# Patient Record
Sex: Female | Born: 1975 | Race: White | Hispanic: No | Marital: Married | Smoking: Never smoker
Health system: Southern US, Community
[De-identification: ages and names within clinical notes are randomized; demographics above are authoritative.]

## PROBLEM LIST (undated history)

## (undated) DIAGNOSIS — N3090 Cystitis, unspecified without hematuria: Secondary | ICD-10-CM

## (undated) DIAGNOSIS — J45909 Unspecified asthma, uncomplicated: Secondary | ICD-10-CM

## (undated) DIAGNOSIS — E271 Primary adrenocortical insufficiency: Secondary | ICD-10-CM

## (undated) DIAGNOSIS — I10 Essential (primary) hypertension: Secondary | ICD-10-CM

## (undated) DIAGNOSIS — M8580 Other specified disorders of bone density and structure, unspecified site: Secondary | ICD-10-CM

## (undated) DIAGNOSIS — N809 Endometriosis, unspecified: Secondary | ICD-10-CM

## (undated) DIAGNOSIS — N83209 Unspecified ovarian cyst, unspecified side: Secondary | ICD-10-CM

## (undated) HISTORY — PX: APPENDECTOMY: SHX54

## (undated) HISTORY — PX: KNEE SURGERY: SHX244

## (undated) HISTORY — PX: ABLATION ON ENDOMETRIOSIS: SHX5787

## (undated) HISTORY — PX: HERNIA REPAIR: SHX51

## (undated) HISTORY — PX: ABDOMINAL HYSTERECTOMY: SHX81

## (undated) HISTORY — PX: OVARIAN CYST REMOVAL: SHX89

## (undated) HISTORY — PX: CHOLECYSTECTOMY: SHX55

---

## 1998-01-20 ENCOUNTER — Encounter: Payer: Self-pay | Admitting: Emergency Medicine

## 1998-01-20 ENCOUNTER — Emergency Department (HOSPITAL_COMMUNITY): Admission: EM | Admit: 1998-01-20 | Discharge: 1998-01-20 | Payer: Self-pay | Admitting: Emergency Medicine

## 1999-07-17 ENCOUNTER — Other Ambulatory Visit: Admission: RE | Admit: 1999-07-17 | Discharge: 1999-07-17 | Payer: Self-pay | Admitting: Family Medicine

## 2000-07-18 ENCOUNTER — Other Ambulatory Visit: Admission: RE | Admit: 2000-07-18 | Discharge: 2000-07-18 | Payer: Self-pay | Admitting: Family Medicine

## 2001-07-21 ENCOUNTER — Other Ambulatory Visit: Admission: RE | Admit: 2001-07-21 | Discharge: 2001-07-21 | Payer: Self-pay | Admitting: Family Medicine

## 2003-09-16 ENCOUNTER — Other Ambulatory Visit: Admission: RE | Admit: 2003-09-16 | Discharge: 2003-09-16 | Payer: Self-pay | Admitting: Family Medicine

## 2004-10-29 ENCOUNTER — Other Ambulatory Visit: Admission: RE | Admit: 2004-10-29 | Discharge: 2004-10-29 | Payer: Self-pay | Admitting: Family Medicine

## 2004-12-31 ENCOUNTER — Ambulatory Visit (HOSPITAL_COMMUNITY): Admission: RE | Admit: 2004-12-31 | Discharge: 2004-12-31 | Payer: Self-pay | Admitting: Family Medicine

## 2005-01-01 ENCOUNTER — Encounter (INDEPENDENT_AMBULATORY_CARE_PROVIDER_SITE_OTHER): Payer: Self-pay | Admitting: *Deleted

## 2005-01-01 ENCOUNTER — Ambulatory Visit (HOSPITAL_COMMUNITY): Admission: RE | Admit: 2005-01-01 | Discharge: 2005-01-01 | Payer: Self-pay | Admitting: General Surgery

## 2005-06-19 ENCOUNTER — Encounter: Admission: RE | Admit: 2005-06-19 | Discharge: 2005-06-19 | Payer: Self-pay | Admitting: Family Medicine

## 2005-11-05 ENCOUNTER — Other Ambulatory Visit: Admission: RE | Admit: 2005-11-05 | Discharge: 2005-11-05 | Payer: Self-pay | Admitting: Family Medicine

## 2006-11-06 ENCOUNTER — Other Ambulatory Visit: Admission: RE | Admit: 2006-11-06 | Discharge: 2006-11-06 | Payer: Self-pay | Admitting: Family Medicine

## 2007-06-24 ENCOUNTER — Encounter: Admission: RE | Admit: 2007-06-24 | Discharge: 2007-06-24 | Payer: Self-pay | Admitting: Family Medicine

## 2007-06-24 ENCOUNTER — Encounter (INDEPENDENT_AMBULATORY_CARE_PROVIDER_SITE_OTHER): Payer: Self-pay | Admitting: Surgery

## 2007-06-24 ENCOUNTER — Ambulatory Visit (HOSPITAL_COMMUNITY): Admission: EM | Admit: 2007-06-24 | Discharge: 2007-06-26 | Payer: Self-pay | Admitting: Emergency Medicine

## 2007-11-10 ENCOUNTER — Other Ambulatory Visit: Admission: RE | Admit: 2007-11-10 | Discharge: 2007-11-10 | Payer: Self-pay | Admitting: Family Medicine

## 2008-02-15 ENCOUNTER — Encounter: Admission: RE | Admit: 2008-02-15 | Discharge: 2008-02-15 | Payer: Self-pay | Admitting: Family Medicine

## 2008-05-30 ENCOUNTER — Encounter: Admission: RE | Admit: 2008-05-30 | Discharge: 2008-05-30 | Payer: Self-pay | Admitting: Family Medicine

## 2010-02-12 ENCOUNTER — Other Ambulatory Visit: Admission: RE | Admit: 2010-02-12 | Discharge: 2010-02-12 | Payer: Self-pay | Admitting: Obstetrics and Gynecology

## 2010-08-21 NOTE — Discharge Summary (Signed)
Marissa Gibson, Marissa Gibson                ACCOUNT NO.:  [id number]   MEDICAL RECORD NO.:  [id number]          PATIENT TYPE:  INP   LOCATION:  1602                         FACILITY:  [facility]   PHYSICIAN:  Maisie Fus A. Cornett, M.D.DATE OF BIRTH:  Jun 11, 1975   DATE OF ADMISSION:  06/24/2007  DATE OF DISCHARGE:  06/25/2007                               DISCHARGE SUMMARY   ADMITTING DIAGNOSIS:  Acute appendicitis.   DISCHARGE DIAGNOSIS:  Acute appendicitis.   PROCEDURES PERFORMED:  Laparoscopic appendectomy.   BRIEF HISTORY:  The patient is a 35 year old female who came in  yesterday with acute appendicitis.  She underwent laparoscopic  appendectomy last night.   HOSPITAL COURSE:  The course was unremarkable.  Postop day 1 she was  afebrile.  A JP drain was in place.  She was draining serous fluid.  Her  abdomen is soft, nontender.  Her diet was advanced, and she was able to  ambulate.  She was discharged home on postoperative day 1 in  satisfactory condition.   DISCHARGE INSTRUCTIONS:  She will follow up in 1 week with me.  We will  have her keep her drain in until next week, and show her how to empty it  daily.  She may shower.  She will not drive for 1 week and will stay out  of work for 2 weeks.  She was given a prescription for Vicodin ES 1 to 2  tablets q.4 h. p.r.n. for pain, and Cipro 500 mg p.o. b.i.d. for 7 days.   CONDITION AT DISCHARGE:  Improved.      Thomas A. Cornett, M.D.  Electronically Signed     TAC/MEDQ  D:  06/25/2007  T:  06/25/2007  Job:  [id number]

## 2010-08-21 NOTE — Op Note (Signed)
NAMEBRYSTAL, Marissa Gibson                ACCOUNT NO.:  [id number]   MEDICAL RECORD NO.:  [id number]          PATIENT TYPE:  EMS   LOCATION:  ED                           FACILITY:  [facility]   PHYSICIAN:  Clovis Pu. Gibson, M.D.DATE OF BIRTH:  1975/10/18   DATE OF PROCEDURE:  06/24/2007  DATE OF DISCHARGE:                               OPERATIVE REPORT   PREOPERATIVE DIAGNOSIS:  Acute appendicitis.   POSTOPERATIVE DIAGNOSIS:  Gangrenous appendicitis.   PROCEDURE:  Laparoscopic appendectomy.   SURGEON:  Harriette Bouillon, M.D.   ANESTHESIA:  General endotracheal anesthesia with 0.25% Sensorcaine.   ESTIMATED BLOOD LOSS:  20 mL.   SPECIMEN:  Gangrenous appendix without evidence of perforation but  significant periappendiceal inflammation involving the terminal ileum  and cecum and appendix sent to pathology for evaluation.   DRAIN:  19 Blake drain to right paracolic gutter inflammatory rind.   INDICATIONS FOR PROCEDURE:  The patient is a 35 year old female with  history of right lower quadrant pain.  She presents for laparoscopic  appendectomy after CT showed acute appendicitis and examination  confirmed that.  Informed consent was obtained.  She voiced  understanding and agreed to proceed.   DESCRIPTION OF PROCEDURE:  The patient brought to the operating room,  placed supine.  Foley catheter was placed.  After induction of general  anesthesia, the abdomen was prepped and draped in sterile fashion.  A  previous laparoscopy scar just below the umbilicus was used for reentry  to the abdominal cavity.  Fascia was opened and I was able to place my  finger in the preperitoneal space, sweep around and find no signs of  adhesions.  Pursestring suture of 0 Vicryl was placed and a 12-mm Hassan  cannula was placed under direct vision.  Pneumoperitoneum was created to  15 mmHg with CO2 and a laparoscope was placed.  Two 5 mm ports were  placed, one in the midline just below the umbilicus, second  left lower  quadrant.  Cecum was identified.  The appendix was adherent to the right  fallopian tube and I was able to grab the appendix and pull this off  that.  The appendix was slightly retrocecal.  It was significantly  inflamed.  There is significant inflammation of the terminal ileum from  this as well as the cecum.  I was able to identify the tip and then used  a harmonic scalpel to take the mesoappendix down until I got to the near  the base the cecum.  I could not quite get all the mesoappendix down, so  I created a window at the junction of the appendix and cecum and then  using a 5-mm scope for visualization was able to insert a GIA 45  stapling device once across the base the appendix and fire it.  I then  was able to better see the mesentery of the appendix and take this down  with the harmonic scalpel taking great care to stay away from the  colonic wall.  There was significant edema and swelling and it was a  little bit difficult to find  the plane between the appendix and the  cecum.  We were very careful about doing this and stayed as close the  appendix as I could with the harmonic scalpel.  The entire appendix was  then removed with an EndoCatch bag.  I carefully inspected the base of  the cecum.  The staple line was intact.  It was hemostatic but there was  significant inflammation.  I had some concern for staple line breakdown  even though the tissue appeared viable.  After irrigating this out, I  carefully examined it and saw no injury to the colon.  I elected to  place a drain due to the inflammatory rind that was associated with the  cecum and the ileum.  We suctioned out any excess irrigation and  hemostasis was excellent.  We then examined the right ovary, fallopian  tube were intact but a little bit inflamed since they were adherent to  the appendix.  Excess irrigation was suctioned out.  I could not see the  left ovary or fallopian tube.  The remainder of the  abdominal  examination was within normal limits.  At this point in time after the  excess irrigation was suctioned out, I placed a 19 Blake drain in the  right paracolic gutter near the cecum.  We secured and pulled it out  through the inferior port site and secured it with 3-0 nylon.  We then  let the CO2 escape, removed our ports and closed the umbilical fascia  with the pursestring suture of 0 Vicryl and 4-0 Monocryl was then used  to close skin incisions.  Dermabond was applied.  All final counts of  sponge, needle and instruments were found to be correct this portion of  the case.  The patient was then awoke, taken to recovery in satisfactory  condition.  All final counts were found be correct x2.      Marissa Gibson, M.D.  Electronically Signed     TAC/MEDQ  D:  06/24/2007  T:  06/25/2007  Job:  [id number]

## 2010-08-21 NOTE — H&P (Signed)
Marissa Gibson, Marissa Gibson                ACCOUNT NO.:  [id number]   MEDICAL RECORD NO.:  [id number]          PATIENT TYPE:  INP   LOCATION:  0098                         FACILITY:  [facility]   PHYSICIAN:  Maisie Fus A. Cornett, M.D.DATE OF BIRTH:  21-Mar-1976   DATE OF ADMISSION:  06/24/2007  DATE OF DISCHARGE:                              HISTORY & PHYSICAL   CHIEF COMPLAINT:  Right lower quadrant pain.   HISTORY OF PRESENT ILLNESS:  The patient is a 35 year old female sent  from Urgent Care due to a one-day history of right lower quadrant pain  and epigastric pain.  The pain is sharp in nature and located in the  right lower quadrant, it is a 10/10, and made worse with movement.  She  does have some episodic waves of pain as well with it.  No nausea,  vomiting, or diarrhea.   PAST MEDICAL HISTORY:  None.   PAST SURGICAL HISTORY:  Cholecystectomy.   FAMILY HISTORY:  Noncontributory.   SOCIAL HISTORY:  Denies tobacco or alcohol use, she is married.   ALLERGIES:  1. CECLOR.  2. SUDAFED.  3. AMITRIPTYLINE.   MEDICATIONS:  Lanetta Inch, Claritin, TriNessa, Voltaren, aspirin, Tylenol,  Phenergan, and Bentyl.   REVIEW OF SYSTEMS:  As stated above, she has had chronic diarrhea,  otherwise negative x15.   PHYSICAL EXAMINATION:  VITAL SIGNS:  Temperature 97, pulse 98, blood  pressure 117/80.  GENERAL APPEARANCE:  A pleasant female in no apparent distress.  HEENT:  Extraocular movements are intact, no evidence of scleral  icterus.  NECK:  Supple, nontender, trachea midline, no mass.  PULMONARY:  Lung sounds are clear, chest wall motion normal.  CARDIOVASCULAR:  Regular rate and rhythm without rub, murmur, or gallop.  EXTREMITIES:  Warm and well perfused.  ABDOMEN:  Tender over her right lower quadrant with rebound and guarding  at McBurney's point, no mass, no hernia.  EXTREMITIES:  Muscle tone normal, range of motion normal.  NEUROLOGICAL EXAMINATION:  Motor and sensory function are grossly  intact.  Glasgow Coma Scale is 15.   CT scan of the abdomen showed acute appendicitis with significant  periappendiceal inflammation without abscess, she has a white count of  13,000.   IMPRESSION:  Acute appendicitis.   PLAN:  We will take her to the operating room for laparoscopic  appendectomy.  The risk of bleeding, infection, abscess, injury to other  organs, fistula, bowel and DVT, are all potential risks of procedure.  She voices her understanding and agrees to proceed.      Thomas A. Cornett, M.D.  Electronically Signed     TAC/MEDQ  D:  06/24/2007  T:  06/24/2007  Job:  [id number]

## 2010-08-24 NOTE — Op Note (Signed)
NAMEGWENITH, TSCHIDA                ACCOUNT NO.:  [id number]   MEDICAL RECORD NO.:  [id number]          PATIENT TYPE:  AMB   LOCATION:  DAY                          FACILITY:  [facility]   PHYSICIAN:  Anselm Pancoast. Weatherly, M.D.DATE OF BIRTH:  [birth year]   DATE OF PROCEDURE:  01/01/2005  DATE OF DISCHARGE:                                 OPERATIVE REPORT   PREOPERATIVE DIAGNOSIS:  Chronic cholecystitis.   POSTOPERATIVE DIAGNOSES:  Not given.   OPERATION:  Laparoscopic cholecystectomy with cholangiogram, a lot of sludge  in the gallbladder.   SURGEON:  Anselm Pancoast. Zachery Dakins, M.D.   ASSISTANT:  Nurse.   HISTORY:  Marissa Gibson is a 35 year old female who was referred to our  office yesterday afternoon with the following history. She was on a cruise  in the [place] last week and on Thursday had the onset of significant  right upper quadrant pain, was seen in the infirmary on the cruise ship, had  an elevated white count and was placed on antibiotics and pain medication  and nausea medication. The following day, the cruise ended, she flew back  and then on Saturday was seen in the [place] walk-in clinic and was thought to  be having an acute gallbladder problem. She was somewhat better and her  white count that had been significantly elevated on Thursday was better at  13,000 and she was recommended to have an ultrasound on Monday morning and  to see her regular physician. She did, the ultrasound showed definitely  sludge within the gallbladder, not really a significantly thickened  gallbladder, no evidence of any common bile duct dilatation and her liver  functions were normal. I saw her in the office yesterday for the first time  and she was definitely still mildly tender in the right upper quadrant, not  tender in the lower abdomen, has a history of kind of irritable bowel  symptoms but has never had a barium enema or problems. She was definitely  not tender in the right lower quadrant.  I recommended that we proceed on  with a laparoscopic cholecystectomy and cholangiogram. I reviewed the x-rays  this morning with the radiologist and her gallbladder is at least 50% just  filled with this very thick cholesterol crystals sludge type area. It was  not pericystic fluid around and I discussed with her this morning sand he  said that she was feeling somewhat better but wanted to proceed on with  surgery. I did not repeat the white count today. Preoperatively, she was  given 3 grams of Unasyn and she has PAS stockings and positioned on the OR  table. Induction of general anesthesia, endotracheal tube, oral tube into  the stomach and then the abdomen was prepped with Betadine surgical scrub  and solution and draped in a sterile manner. She has had no previous  abdominal surgery. A small incision was made below the umbilicus. The fascia  was picked up with two Kocher's and a small opening carefully made into the  peritoneal cavity. The pursestring suture of #0 Vicryl was placed, the  Hasson cannula introduced. The  upper inspection, the gallbladder was  distended but not cherry red or acutely inflamed. The upper 10 mm trocar was  placed in the subxiphoid area under direct vision after anesthetizing the  fascia and the two lateral 5 mm trocars were placed at the appropriate  anterior lateral position. The gallbladder was grasped and retracted upward  and outward and the peritoneum was kind of carefully dissected from the  gallbladder. It was definitely subacutely inflamed and possibly a stone so  it impacted right at the cystic duct gallbladder junction when you were  manipulating it. I sort of encompassed the cystic duct, stripped it free of  the surrounding fibrous tissue and clipped it from the junction to the  gallbladder and then made a little opening proximally. A Cook catheter was  introduced and an x-ray was obtained. There was good prompt filling in the  extrahepatic biliary  system, good flow into the duodenum and no evidence of  any stone. In opening the cystic duct, there was kind of the old cholesterol  crystals type thing within the cystic duct but not an actual stone. The  catheter was removed, the cystic duct proximally was triply clipped and then  divided. The cystic artery was next separated from the surrounding areolar  tissue and this was doubly clipped proximally, singly distally and divided.  A posterior branch of the cystic artery was likewise freed up, elevated,  clipped proximally and then divided with cautery and then the gallbladder  was freed from its bed with the hook electrocautery. No bile was spilled and  the gallbladder was placed in an EndoCatch bag. The camera was switched to  the upper 10 mL port, the gallbladder bag removed and reinspection showed  good hemostasis and I had not used any irrigating fluid. I then placed an  additional figure-of-eight suture in the fascia at the umbilicus and  anesthetized the fascia. Both sutures were tied and then I placed a single  stitch in the fascia at the midline. Since she is so thin, you could see  through and to the peritoneal cavity. In the subcutaneous wounds, there was  a little bit of bleeding in the medial 5 mm port side and I lightly  cauterized the little subcutaneous bleeder, put a little adrenaline and  Marcaine in this subcuticular and then closed the subcuticular wounds with 4-  0 Vicryl, Benzoin and Steri-Strips on the skin. The patient tolerated the  procedure nicely. I opened the gallbladder on the back and did not see any  definite stones but she has got this kind of sludge sand thickened early  cholesterolosis early gallstones. The patient hopefully will have no further  problems and we will leave it to her whether she is released this afternoon  or in the morning.           ______________________________ Anselm Pancoast. Zachery Dakins, M.D.     WJW/MEDQ  D:  01/01/2005  T:   01/02/2005  Job:  [id number]

## 2010-12-04 ENCOUNTER — Other Ambulatory Visit: Payer: Self-pay | Admitting: Obstetrics and Gynecology

## 2010-12-04 DIAGNOSIS — N979 Female infertility, unspecified: Secondary | ICD-10-CM

## 2010-12-06 ENCOUNTER — Other Ambulatory Visit: Payer: Self-pay | Admitting: Obstetrics and Gynecology

## 2010-12-06 ENCOUNTER — Ambulatory Visit (HOSPITAL_COMMUNITY)
Admission: RE | Admit: 2010-12-06 | Discharge: 2010-12-06 | Disposition: A | Discharge status: Home / Self Care | Payer: 59 | Source: Ambulatory Visit | Attending: Obstetrics and Gynecology | Admitting: Obstetrics and Gynecology

## 2010-12-06 DIAGNOSIS — R102 Pelvic and perineal pain: Secondary | ICD-10-CM

## 2010-12-06 DIAGNOSIS — N949 Unspecified condition associated with female genital organs and menstrual cycle: Secondary | ICD-10-CM | POA: Insufficient documentation

## 2010-12-06 DIAGNOSIS — N979 Female infertility, unspecified: Secondary | ICD-10-CM | POA: Insufficient documentation

## 2010-12-06 MED ORDER — IOHEXOL 300 MG/ML  SOLN
20.0000 mL | Freq: Once | INTRAMUSCULAR | Status: AC | PRN
  Administered 2010-12-06: 20 mL

## 2010-12-31 LAB — URINALYSIS, ROUTINE W REFLEX MICROSCOPIC
Protein, ur: NEGATIVE
Urobilinogen, UA: 0.2

## 2010-12-31 LAB — DIFFERENTIAL
Basophils Absolute: 0.2 — ABNORMAL HIGH
Basophils Relative: 1
Eosinophils Absolute: 0.5
Eosinophils Relative: 3
Lymphocytes Relative: 23

## 2010-12-31 LAB — CBC
HCT: 41.5
MCHC: 35.2
MCV: 92.9
Platelets: 382
Platelets: 453 — ABNORMAL HIGH
RDW: 12.1
WBC: 9.3

## 2010-12-31 LAB — BASIC METABOLIC PANEL
BUN: 3 — ABNORMAL LOW
GFR calc non Af Amer: >60
Glucose, Bld: 94
Potassium: 3.6

## 2010-12-31 LAB — URINE MICROSCOPIC-ADD ON

## 2011-04-08 ENCOUNTER — Other Ambulatory Visit (HOSPITAL_COMMUNITY)
Admission: RE | Admit: 2011-04-08 | Discharge: 2011-04-08 | Disposition: A | Discharge status: Home / Self Care | Payer: 59 | Source: Ambulatory Visit | Attending: Obstetrics and Gynecology | Admitting: Obstetrics and Gynecology

## 2011-04-08 ENCOUNTER — Other Ambulatory Visit: Payer: Self-pay | Admitting: Obstetrics and Gynecology

## 2011-04-08 DIAGNOSIS — Z01419 Encounter for gynecological examination (general) (routine) without abnormal findings: Secondary | ICD-10-CM | POA: Insufficient documentation

## 2012-04-06 ENCOUNTER — Other Ambulatory Visit: Payer: Self-pay | Admitting: Obstetrics and Gynecology

## 2012-04-06 ENCOUNTER — Other Ambulatory Visit (HOSPITAL_COMMUNITY)
Admission: RE | Admit: 2012-04-06 | Discharge: 2012-04-06 | Disposition: A | Discharge status: Home / Self Care | Payer: 59 | Source: Ambulatory Visit | Attending: Obstetrics and Gynecology | Admitting: Obstetrics and Gynecology

## 2012-04-06 DIAGNOSIS — Z01419 Encounter for gynecological examination (general) (routine) without abnormal findings: Secondary | ICD-10-CM | POA: Insufficient documentation

## 2012-04-06 DIAGNOSIS — Z1151 Encounter for screening for human papillomavirus (HPV): Secondary | ICD-10-CM | POA: Insufficient documentation

## 2013-03-26 ENCOUNTER — Other Ambulatory Visit: Payer: Self-pay | Admitting: Physician Assistant

## 2013-03-26 ENCOUNTER — Ambulatory Visit
Admission: RE | Admit: 2013-03-26 | Discharge: 2013-03-26 | Disposition: A | Discharge status: Home / Self Care | Payer: BC Managed Care – PPO | Source: Ambulatory Visit | Attending: Physician Assistant | Admitting: Physician Assistant

## 2013-03-26 DIAGNOSIS — R52 Pain, unspecified: Secondary | ICD-10-CM

## 2014-02-23 ENCOUNTER — Other Ambulatory Visit: Payer: Self-pay | Admitting: Allergy and Immunology

## 2014-02-23 ENCOUNTER — Other Ambulatory Visit: Payer: Self-pay | Admitting: Pediatrics

## 2014-02-23 ENCOUNTER — Other Ambulatory Visit: Payer: Self-pay | Admitting: *Deleted

## 2014-02-23 DIAGNOSIS — J329 Chronic sinusitis, unspecified: Secondary | ICD-10-CM

## 2014-03-01 ENCOUNTER — Ambulatory Visit
Admission: RE | Admit: 2014-03-01 | Discharge: 2014-03-01 | Disposition: A | Discharge status: Home / Self Care | Payer: BC Managed Care – PPO | Source: Ambulatory Visit | Attending: Pediatrics | Admitting: Pediatrics

## 2014-03-01 DIAGNOSIS — J329 Chronic sinusitis, unspecified: Secondary | ICD-10-CM

## 2014-04-28 ENCOUNTER — Other Ambulatory Visit: Payer: Self-pay | Admitting: Dermatology

## 2014-05-17 ENCOUNTER — Other Ambulatory Visit: Payer: Self-pay | Admitting: Dermatology

## 2014-11-08 ENCOUNTER — Encounter (HOSPITAL_COMMUNITY): Payer: Self-pay | Admitting: Emergency Medicine

## 2014-11-08 ENCOUNTER — Emergency Department (HOSPITAL_COMMUNITY): Payer: 59

## 2014-11-08 ENCOUNTER — Emergency Department (HOSPITAL_COMMUNITY)
Admission: EM | Admit: 2014-11-08 | Discharge: 2014-11-08 | Disposition: A | Discharge status: Home / Self Care | Payer: 59 | Attending: Emergency Medicine | Admitting: Emergency Medicine

## 2014-11-08 DIAGNOSIS — Z79899 Other long term (current) drug therapy: Secondary | ICD-10-CM | POA: Insufficient documentation

## 2014-11-08 DIAGNOSIS — R1031 Right lower quadrant pain: Secondary | ICD-10-CM

## 2014-11-08 DIAGNOSIS — Z7951 Long term (current) use of inhaled steroids: Secondary | ICD-10-CM | POA: Diagnosis not present

## 2014-11-08 DIAGNOSIS — R102 Pelvic and perineal pain: Secondary | ICD-10-CM

## 2014-11-08 DIAGNOSIS — R1030 Lower abdominal pain, unspecified: Secondary | ICD-10-CM | POA: Diagnosis present

## 2014-11-08 DIAGNOSIS — Z3202 Encounter for pregnancy test, result negative: Secondary | ICD-10-CM | POA: Insufficient documentation

## 2014-11-08 DIAGNOSIS — N830 Follicular cyst of ovary: Secondary | ICD-10-CM | POA: Diagnosis not present

## 2014-11-08 DIAGNOSIS — J45909 Unspecified asthma, uncomplicated: Secondary | ICD-10-CM | POA: Diagnosis not present

## 2014-11-08 DIAGNOSIS — N83201 Unspecified ovarian cyst, right side: Secondary | ICD-10-CM

## 2014-11-08 HISTORY — DX: Unspecified ovarian cyst, unspecified side: N83.209

## 2014-11-08 HISTORY — DX: Endometriosis, unspecified: N80.9

## 2014-11-08 HISTORY — DX: Cystitis, unspecified without hematuria: N30.90

## 2014-11-08 HISTORY — DX: Unspecified asthma, uncomplicated: J45.909

## 2014-11-08 LAB — COMPREHENSIVE METABOLIC PANEL
ALK PHOS: 67 U/L (ref 38–126)
ALT: 25 U/L (ref 14–54)
ANION GAP: 8 (ref 5–15)
AST: 22 U/L (ref 15–41)
Albumin: 3.9 g/dL (ref 3.5–5.0)
BILIRUBIN TOTAL: 0.4 mg/dL (ref 0.3–1.2)
BUN: 7 mg/dL (ref 6–20)
CO2: 24 mmol/L (ref 22–32)
CREATININE: 0.71 mg/dL (ref 0.44–1.00)
Calcium: 9.3 mg/dL (ref 8.9–10.3)
Chloride: 103 mmol/L (ref 101–111)
GFR calc Af Amer: >60 mL/min (ref >60)
GFR calc non Af Amer: >60 mL/min (ref >60)
Glucose, Bld: 87 mg/dL (ref 65–99)
Potassium: 3.9 mmol/L (ref 3.5–5.1)
Sodium: 135 mmol/L (ref 135–145)
Total Protein: 7 g/dL (ref 6.5–8.1)

## 2014-11-08 LAB — URINALYSIS, ROUTINE W REFLEX MICROSCOPIC
Bilirubin Urine: NEGATIVE
Glucose, UA: NEGATIVE mg/dL
HGB URINE DIPSTICK: NEGATIVE
KETONES UR: NEGATIVE mg/dL
Leukocytes, UA: NEGATIVE
NITRITE: NEGATIVE
PH: 6.5 (ref 5.0–8.0)
Protein, ur: NEGATIVE mg/dL
SPECIFIC GRAVITY, URINE: 1.005 (ref 1.005–1.030)
UROBILINOGEN UA: 0.2 mg/dL (ref 0.0–1.0)

## 2014-11-08 LAB — CBC WITH DIFFERENTIAL/PLATELET
BASOS ABS: 0 10*3/uL (ref 0.0–0.1)
BASOS PCT: 0 % (ref 0–1)
EOS PCT: 1 % (ref 0–5)
Eosinophils Absolute: 0.2 10*3/uL (ref 0.0–0.7)
HCT: 42.4 % (ref 36.0–46.0)
HEMOGLOBIN: 15 g/dL (ref 12.0–15.0)
LYMPHS ABS: 3.1 10*3/uL (ref 0.7–4.0)
LYMPHS PCT: 24 % (ref 12–46)
MCH: 32.8 pg (ref 26.0–34.0)
MCHC: 35.4 g/dL (ref 30.0–36.0)
MCV: 92.8 fL (ref 78.0–100.0)
MONO ABS: 0.8 10*3/uL (ref 0.1–1.0)
Monocytes Relative: 6 % (ref 3–12)
Neutro Abs: 9.1 10*3/uL — ABNORMAL HIGH (ref 1.7–7.7)
Neutrophils Relative %: 69 % (ref 43–77)
PLATELETS: 381 10*3/uL (ref 150–400)
RBC: 4.57 MIL/uL (ref 3.87–5.11)
RDW: 12.3 % (ref 11.5–15.5)
WBC: 13.1 10*3/uL — AB (ref 4.0–10.5)

## 2014-11-08 LAB — LIPASE, BLOOD: Lipase: 20 U/L — ABNORMAL LOW (ref 22–51)

## 2014-11-08 LAB — PREGNANCY, URINE: Preg Test, Ur: NEGATIVE

## 2014-11-08 MED ORDER — HYDROMORPHONE HCL 1 MG/ML IJ SOLN
1.0000 mg | Freq: Once | INTRAMUSCULAR | Status: AC
  Administered 2014-11-08: 1 mg via INTRAVENOUS
  Filled 2014-11-08: qty 1

## 2014-11-08 MED ORDER — PROMETHAZINE HCL 25 MG/ML IJ SOLN
25.0000 mg | Freq: Once | INTRAMUSCULAR | Status: AC
  Administered 2014-11-08: 25 mg via INTRAVENOUS
  Filled 2014-11-08: qty 1

## 2014-11-08 MED ORDER — HYDROCODONE-ACETAMINOPHEN 5-325 MG PO TABS: 1.0000 | ORAL_TABLET | ORAL | Status: DC | PRN

## 2014-11-08 MED ORDER — PROMETHAZINE HCL 25 MG PO TABS: 25.0000 mg | ORAL_TABLET | Freq: Four times a day (QID) | ORAL | Status: DC | PRN

## 2014-11-08 MED ORDER — ONDANSETRON HCL 4 MG/2ML IJ SOLN
4.0000 mg | Freq: Once | INTRAMUSCULAR | Status: AC
  Administered 2014-11-08: 4 mg via INTRAVENOUS
  Filled 2014-11-08: qty 2

## 2014-11-08 NOTE — ED Notes (Signed)
Pt in ultrasound, family in room.

## 2014-11-08 NOTE — ED Notes (Signed)
Bladder scan result= 340 ml.

## 2014-11-08 NOTE — ED Provider Notes (Signed)
CSN: [id number]     Arrival date & time 11/08/14  0510 History   First MD Initiated Contact with Patient 11/08/14 [phone]     Chief Complaint  Patient presents with  . Abdominal Pain     (Consider location/radiation/quality/duration/timing/severity/associated sxs/prior Treatment) HPI Comments: 39 year old female with a past medical history of interstitial cystitis, endometriosis, ovarian cyst and asthma presenting with gradual onset suprapubic abdominal pain beginning at 4 AM today. Patient reports she usually wakes up every 2 hours to urinate throughout the night, woke up at 2 AM and was feeling fine, and again 2 hours later when she developed waxing and waning, both sharp and pressure-like pain rated 8/10. Admits to associated nausea without vomiting. States she has a sensation that she cannot fully empty her bladder which is not normal. Admits to dysuria. Denies vaginal bleeding or discharge. Has an IUD. History of ovarian cyst removal, endometrial abrasion, cholecystectomy and appendectomy.  Patient is a 39 y.o. female presenting with abdominal pain. The history is provided by the patient.  Abdominal Pain Associated symptoms: dysuria and nausea     Past Medical History  Diagnosis Date  . Asthma   . Endometriosis   . Ovarian cyst   . Cystitis    Past Surgical History  Procedure Laterality Date  . Cholecystectomy    . Knee surgery    . Appendectomy    . Ovarian cyst removal    . Ablation on endometriosis     No family history on file. History  Substance Use Topics  . Smoking status: Never Smoker   . Smokeless tobacco: Not on file  . Alcohol Use: Yes   OB History    No data available     Review of Systems  Gastrointestinal: Positive for nausea and abdominal pain.  Genitourinary: Positive for dysuria.       + Oliguria.  All other systems reviewed and are negative.     Allergies  Omalizumab; Amitriptyline; Cymbalta; Pseudoephedrine; Sudafed; Ceclor; Neomycin-bacitracin  zn-polymyx; and Tape  Home Medications   Prior to Admission medications   Medication Sig Start Date End Date Taking? Authorizing Provider  acetaminophen (TYLENOL) 325 MG tablet Take 650 mg by mouth every 6 (six) hours as needed for mild pain.   Yes Historical Provider, MD  albuterol (PROVENTIL HFA;VENTOLIN HFA) 108 (90 BASE) MCG/ACT inhaler Inhale 1 puff into the lungs every 6 (six) hours as needed for wheezing or shortness of breath.   Yes Historical Provider, MD  albuterol-ipratropium (COMBIVENT) 18-103 MCG/ACT inhaler Inhale 1 puff into the lungs 4 (four) times daily.   Yes Historical Provider, MD  B Complex-C (SUPER B COMPLEX/VITAMIN C PO) Take 1 tablet by mouth daily.   Yes Historical Provider, MD  beclomethasone (QVAR) 80 MCG/ACT inhaler Inhale 2 puffs into the lungs 3 (three) times daily.   Yes Historical Provider, MD  Beclomethasone Dipropionate (QNASL) 80 MCG/ACT AERS Place 1 puff into the nose 2 (two) times daily.   Yes Historical Provider, MD  bupivacaine (MARCAINE) 0.5 % SOLN injection 10 mLs by Infiltration route See admin instructions. 1-2 times per ay combined with Sodium Bicarbonate for bladder instillation   Yes Historical Provider, MD  calcium-vitamin D (OSCAL WITH D) 500-200 MG-UNIT per tablet Take 1 tablet by mouth 2 (two) times daily.   Yes Historical Provider, MD  cholestyramine light (PREVALITE) 4 GM/DOSE powder Take 4 g by mouth 2 (two) times daily with a meal.   Yes Historical Provider, MD  CRANBERRY EXTRACT PO Take 1-2  tablets by mouth daily.   Yes Historical Provider, MD  cycloSPORINE (RESTASIS) 0.05 % ophthalmic emulsion Place 2 drops into both eyes 2 (two) times daily.   Yes Historical Provider, MD  EPINEPHrine (EPIPEN 2-PAK) 0.3 mg/0.3 mL IJ SOAJ injection Inject 0.3 mg into the muscle as needed (for anaphylaxis).   Yes Historical Provider, MD  FIBER PO Take 2 tablets by mouth daily.   Yes Historical Provider, MD  glucosamine-chondroitin 500-400 MG tablet Take 1  tablet by mouth daily.   Yes Historical Provider, MD  guaiFENesin (MUCINEX) 600 MG 12 hr tablet Take 600 mg by mouth 2 (two) times daily as needed for cough.   Yes Historical Provider, MD  heparin 16109 UNIT/ML injection Inject 40,000 Units into the skin See admin instructions. 1 to 2 times per day combined with bupivacaine for bladder instillation   Yes Historical Provider, MD  levocetirizine (XYZAL) 5 MG tablet Take 5 mg by mouth every evening.   Yes Historical Provider, MD  levonorgestrel (MIRENA) 20 MCG/24HR IUD 1 each by Intrauterine route once.   Yes Historical Provider, MD  magnesium oxide (MAG-OX) 400 MG tablet Take 400 mg by mouth daily.   Yes Historical Provider, MD  metaxalone (SKELAXIN) 800 MG tablet Take 800 mg by mouth as needed for muscle spasms.   Yes Historical Provider, MD  Meth-Hyo-M Bl-Benz Acd-Ph Sal (HYOPHEN) 81.6 MG TABS Take 81.6 mg by mouth 2 (two) times daily.   Yes Historical Provider, MD  Multiple Vitamin (MULTIVITAMIN WITH MINERALS) TABS tablet Take 1 tablet by mouth daily.   Yes Historical Provider, MD  pentosan polysulfate (ELMIRON) 100 MG capsule Take 100 mg by mouth 3 (three) times daily.   Yes Historical Provider, MD  SODIUM BICARBONATE IV Inject 2-3 mLs into the vein See admin instructions. 1 to 2 times per day combined with bupivacaine for bladder instillation   Yes Historical Provider, MD  HYDROcodone-acetaminophen (NORCO/VICODIN) 5-325 MG per tablet Take 1-2 tablets by mouth every 4 (four) hours as needed. 11/08/14   Kathrynn Speed, PA-C  promethazine (PHENERGAN) 25 MG tablet Take 1 tablet (25 mg total) by mouth every 6 (six) hours as needed for nausea or vomiting. 11/08/14   Genelle Economou M Matison Nuccio, PA-C   BP 115/73 mmHg  Pulse 82  Temp(Src) 98.5 F (36.9 C) (Oral)  Resp 18  Ht 5\' 6"  (1.676 m)  Wt 155 lb (70.308 kg)  BMI 25.03 kg/m2  SpO2 95% Physical Exam  Constitutional: She is oriented to person, place, and time. She appears well-developed and well-nourished. No  distress.  HENT:  Head: Normocephalic and atraumatic.  Mouth/Throat: Oropharynx is clear and moist.  Eyes: Conjunctivae and EOM are normal.  Neck: Normal range of motion. Neck supple.  Cardiovascular: Normal rate, regular rhythm and normal heart sounds.   Pulmonary/Chest: Effort normal and breath sounds normal. No respiratory distress.  Abdominal: Soft. Normal appearance and bowel sounds are normal. She exhibits no distension. There is tenderness. There is guarding. There is no rigidity, no rebound and no CVA tenderness.  Middle and R suprapubic tenderness. No peritoneal signs.  Genitourinary: Vagina normal and uterus normal. Cervix exhibits no motion tenderness, no discharge and no friability. Right adnexum displays no tenderness. Left adnexum displays no tenderness. No erythema or bleeding in the vagina. No vaginal discharge found.  Suprapubic tenderness.  Musculoskeletal: Normal range of motion. She exhibits no edema.  Neurological: She is alert and oriented to person, place, and time. No sensory deficit.  Skin: Skin is warm  and dry.  Psychiatric: She has a normal mood and affect. Her behavior is normal.  Nursing note and vitals reviewed.   ED Course  Procedures (including critical care time) Labs Review Labs Reviewed  LIPASE, BLOOD - Abnormal; Notable for the following:    Lipase 20 (*)    All other components within normal limits  CBC WITH DIFFERENTIAL/PLATELET - Abnormal; Notable for the following:    WBC 13.1 (*)    Neutro Abs 9.1 (*)    All other components within normal limits  COMPREHENSIVE METABOLIC PANEL  URINALYSIS, ROUTINE W REFLEX MICROSCOPIC (NOT AT [facility])  PREGNANCY, URINE  POC URINE PREG, ED    Imaging Review US Pelvis Limited  11/08/2014   CLINICAL DATA:  Suprapubic pain.  EXAM: LIMITED ULTRASOUND OF PELVIS  TECHNIQUE: Limited transabdominal ultrasound examination of the pelvis was performed.  COMPARISON:  None.  FINDINGS: Ultrasound of the bladder was  requested. The bladder is within normal limits in the postvoid state. No mass lesion or hemorrhage is present. A 3.5 x 3.3 x 3.7 cm right ovarian cyst is incidentally noted. This appears simple.  IMPRESSION: 1. Normal postvoid appearance of the urinary bladder. 2. Incidental 3.7 cm right ovarian cyst.   Electronically Signed   By: Marin Roberts M.D.   On: 11/08/2014 07:35     EKG Interpretation None      MDM   Final diagnoses:  Suprapubic abdominal pain, right  Right ovarian cyst   Non-toxic appearing, uncomfortable but in NAD. AFVSS. Abdomen is soft with severe suprapubic tenderness. UA negative. Labs significant for leukocytosis of 13.1. Bladder scan with 340 mL. Plan to obtain pelvic US.  Ultrasound showing normal appearance of the bladder, incidental 3.7 cm right ovarian cyst present. This is most likely the cause of patient's pain. On pelvic exam, there is no vaginal or cervical discharge, cervical motion tenderness or adnexal tenderness. She has tenderness in her suprapubic region. After receiving pain medication in the ED, patient reports significant improvement of her pain. Abdomen is still tender, improved from initial exam, still without peritoneal signs. Will discharge the patient home with Phenergan and vicodin, f/u with her GYN. Stable for d/c. Return precautions given. Patient states understanding of treatment care plan and is agreeable.  Kathrynn Speed, PA-C 11/08/14 1610  Marisa Severin, MD 11/08/14 [phone]

## 2014-11-08 NOTE — ED Notes (Signed)
Patient transported to Ultrasound 

## 2014-11-08 NOTE — ED Notes (Signed)
Pt was able to urinate and stated that quite a bit of urine was released.

## 2014-11-08 NOTE — ED Notes (Signed)
Pt. reports hypogastric pain with nausea , dysuria /oliguria onset this morning  , denies fever or chills.

## 2014-11-08 NOTE — Discharge Instructions (Signed)
Take Vicodin for severe pain only. No driving or operating heavy machinery while taking vicodin. This medication may cause drowsiness. Take phenergan as directed as needed for nausea, no driving or operating heavy machinery while taking this drug as it may cause drowsiness.  Abdominal Pain, Women Abdominal (stomach, pelvic, or belly) pain can be caused by many things. It is important to tell your doctor:  The location of the pain.  Does it come and go or is it present all the time?  Are there things that start the pain (eating certain foods, exercise)?  Are there other symptoms associated with the pain (fever, nausea, vomiting, diarrhea)? All of this is helpful to know when trying to find the cause of the pain. CAUSES   Stomach: virus or bacteria infection, or ulcer.  Intestine: appendicitis (inflamed appendix), regional ileitis (Crohn's disease), ulcerative colitis (inflamed colon), irritable bowel syndrome, diverticulitis (inflamed diverticulum of the colon), or cancer of the stomach or intestine.  Gallbladder disease or stones in the gallbladder.  Kidney disease, kidney stones, or infection.  Pancreas infection or cancer.  Fibromyalgia (pain disorder).  Diseases of the female organs:  Uterus: fibroid (non-cancerous) tumors or infection.  Fallopian tubes: infection or tubal pregnancy.  Ovary: cysts or tumors.  Pelvic adhesions (scar tissue).  Endometriosis (uterus lining tissue growing in the pelvis and on the pelvic organs).  Pelvic congestion syndrome (female organs filling up with blood just before the menstrual period).  Pain with the menstrual period.  Pain with ovulation (producing an egg).  Pain with an IUD (intrauterine device, birth control) in the uterus.  Cancer of the female organs.  Functional pain (pain not caused by a disease, may improve without treatment).  Psychological pain.  Depression. DIAGNOSIS  Your doctor will decide the seriousness of  your pain by doing an examination.  Blood tests.  X-rays.  Ultrasound.  CT scan (computed tomography, special type of X-ray).  MRI (magnetic resonance imaging).  Cultures, for infection.  Barium enema (dye inserted in the large intestine, to better view it with X-rays).  Colonoscopy (looking in intestine with a lighted tube).  Laparoscopy (minor surgery, looking in abdomen with a lighted tube).  Major abdominal exploratory surgery (looking in abdomen with a large incision). TREATMENT  The treatment will depend on the cause of the pain.   Many cases can be observed and treated at home.  Over-the-counter medicines recommended by your caregiver.  Prescription medicine.  Antibiotics, for infection.  Birth control pills, for painful periods or for ovulation pain.  Hormone treatment, for endometriosis.  Nerve blocking injections.  Physical therapy.  Antidepressants.  Counseling with a psychologist or psychiatrist.  Minor or major surgery. HOME CARE INSTRUCTIONS   Do not take laxatives, unless directed by your caregiver.  Take over-the-counter pain medicine only if ordered by your caregiver. Do not take aspirin because it can cause an upset stomach or bleeding.  Try a clear liquid diet (broth or water) as ordered by your caregiver. Slowly move to a bland diet, as tolerated, if the pain is related to the stomach or intestine.  Have a thermometer and take your temperature several times a day, and record it.  Bed rest and sleep, if it helps the pain.  Avoid sexual intercourse, if it causes pain.  Avoid stressful situations.  Keep your follow-up appointments and tests, as your caregiver orders.  If the pain does not go away with medicine or surgery, you may try:  Acupuncture.  Relaxation exercises (yoga, meditation).  Group therapy.  Counseling. SEEK MEDICAL CARE IF:   You notice certain foods cause stomach pain.  Your home care treatment is not  helping your pain.  You need stronger pain medicine.  You want your IUD removed.  You feel faint or lightheaded.  You develop nausea and vomiting.  You develop a rash.  You are having side effects or an allergy to your medicine. SEEK IMMEDIATE MEDICAL CARE IF:   Your pain does not go away or gets worse.  You have a fever.  Your pain is felt only in portions of the abdomen. The right side could possibly be appendicitis. The left lower portion of the abdomen could be colitis or diverticulitis.  You are passing blood in your stools (bright red or black tarry stools, with or without vomiting).  You have blood in your urine.  You develop chills, with or without a fever.  You pass out. MAKE SURE YOU:   Understand these instructions.  Will watch your condition.  Will get help right away if you are not doing well or get worse. Document Released: 01/20/2007 Document Revised: 08/09/2013 Document Reviewed: 02/09/2009 [facility] Patient Information 2015 [place], [place]. This information is not intended to replace advice given to you by your health care provider. Make sure you discuss any questions you have with your health care provider.  Ovarian Cyst An ovarian cyst is a fluid-filled sac that forms on an ovary. The ovaries are small organs that produce eggs in women. Various types of cysts can form on the ovaries. Most are not cancerous. Many do not cause problems, and they often go away on their own. Some may cause symptoms and require treatment. Common types of ovarian cysts include:  Functional cysts--These cysts may occur every month during the menstrual cycle. This is normal. The cysts usually go away with the next menstrual cycle if the woman does not get pregnant. Usually, there are no symptoms with a functional cyst.  Endometrioma cysts--These cysts form from the tissue that lines the uterus. They are also called "chocolate cysts" because they become filled with blood that turns  brown. This type of cyst can cause pain in the lower abdomen during intercourse and with your menstrual period.  Cystadenoma cysts--This type develops from the cells on the outside of the ovary. These cysts can get very big and cause lower abdomen pain and pain with intercourse. This type of cyst can twist on itself, cut off its blood supply, and cause severe pain. It can also easily rupture and cause a lot of pain.  Dermoid cysts--This type of cyst is sometimes found in both ovaries. These cysts may contain different kinds of body tissue, such as skin, teeth, hair, or cartilage. They usually do not cause symptoms unless they get very big.  Theca lutein cysts--These cysts occur when too much of a certain hormone (human chorionic gonadotropin) is produced and overstimulates the ovaries to produce an egg. This is most common after procedures used to assist with the conception of a baby (in vitro fertilization). CAUSES   Fertility drugs can cause a condition in which multiple large cysts are formed on the ovaries. This is called ovarian hyperstimulation syndrome.  A condition called polycystic ovary syndrome can cause hormonal imbalances that can lead to nonfunctional ovarian cysts. SIGNS AND SYMPTOMS  Many ovarian cysts do not cause symptoms. If symptoms are present, they may include:  Pelvic pain or pressure.  Pain in the lower abdomen.  Pain during sexual intercourse.  Increasing  girth (swelling) of the abdomen.  Abnormal menstrual periods.  Increasing pain with menstrual periods.  Stopping having menstrual periods without being pregnant. DIAGNOSIS  These cysts are commonly found during a routine or annual pelvic exam. Tests may be ordered to find out more about the cyst. These tests may include:  Ultrasound.  X-ray of the pelvis.  CT scan.  MRI.  Blood tests. TREATMENT  Many ovarian cysts go away on their own without treatment. Your health care provider may want to check  your cyst regularly for 2-3 months to see if it changes. For women in menopause, it is particularly important to monitor a cyst closely because of the higher rate of ovarian cancer in menopausal women. When treatment is needed, it may include any of the following:  A procedure to drain the cyst (aspiration). This may be done using a long needle and ultrasound. It can also be done through a laparoscopic procedure. This involves using a thin, lighted tube with a tiny camera on the end (laparoscope) inserted through a small incision.  Surgery to remove the whole cyst. This may be done using laparoscopic surgery or an open surgery involving a larger incision in the lower abdomen.  Hormone treatment or birth control pills. These methods are sometimes used to help dissolve a cyst. HOME CARE INSTRUCTIONS   Only take over-the-counter or prescription medicines as directed by your health care provider.  Follow up with your health care provider as directed.  Get regular pelvic exams and Pap tests. SEEK MEDICAL CARE IF:   Your periods are late, irregular, or painful, or they stop.  Your pelvic pain or abdominal pain does not go away.  Your abdomen becomes larger or swollen.  You have pressure on your bladder or trouble emptying your bladder completely.  You have pain during sexual intercourse.  You have feelings of fullness, pressure, or discomfort in your stomach.  You lose weight for no apparent reason.  You feel generally ill.  You become constipated.  You lose your appetite.  You develop acne.  You have an increase in body and facial hair.  You are gaining weight, without changing your exercise and eating habits.  You think you are pregnant. SEEK IMMEDIATE MEDICAL CARE IF:   You have increasing abdominal pain.  You feel sick to your stomach (nauseous), and you throw up (vomit).  You develop a fever that comes on suddenly.  You have abdominal pain during a bowel  movement.  Your menstrual periods become heavier than usual. MAKE SURE YOU:  Understand these instructions.  Will watch your condition.  Will get help right away if you are not doing well or get worse. Document Released: 03/25/2005 Document Revised: 03/30/2013 Document Reviewed: 11/30/2012 [facility] Patient Information 2015 [place], [place]. This information is not intended to replace advice given to you by your health care provider. Make sure you discuss any questions you have with your health care provider.

## 2015-04-11 ENCOUNTER — Other Ambulatory Visit (HOSPITAL_COMMUNITY)
Admission: RE | Admit: 2015-04-11 | Discharge: 2015-04-11 | Disposition: A | Discharge status: Home / Self Care | Payer: BLUE CROSS/BLUE SHIELD | Source: Ambulatory Visit | Attending: Obstetrics and Gynecology | Admitting: Obstetrics and Gynecology

## 2015-04-11 ENCOUNTER — Other Ambulatory Visit: Payer: Self-pay | Admitting: Obstetrics and Gynecology

## 2015-04-11 DIAGNOSIS — Z01419 Encounter for gynecological examination (general) (routine) without abnormal findings: Secondary | ICD-10-CM | POA: Diagnosis present

## 2015-04-11 DIAGNOSIS — Z1151 Encounter for screening for human papillomavirus (HPV): Secondary | ICD-10-CM | POA: Diagnosis not present

## 2015-04-14 LAB — CYTOLOGY - PAP

## 2015-07-04 ENCOUNTER — Encounter (HOSPITAL_COMMUNITY): Payer: Self-pay | Admitting: *Deleted

## 2015-07-04 ENCOUNTER — Emergency Department (HOSPITAL_COMMUNITY)
Admission: EM | Admit: 2015-07-04 | Discharge: 2015-07-04 | Disposition: A | Discharge status: Home / Self Care | Payer: BLUE CROSS/BLUE SHIELD | Attending: Emergency Medicine | Admitting: Emergency Medicine

## 2015-07-04 ENCOUNTER — Emergency Department (HOSPITAL_COMMUNITY): Payer: BLUE CROSS/BLUE SHIELD

## 2015-07-04 DIAGNOSIS — Z8742 Personal history of other diseases of the female genital tract: Secondary | ICD-10-CM | POA: Diagnosis not present

## 2015-07-04 DIAGNOSIS — Z79899 Other long term (current) drug therapy: Secondary | ICD-10-CM | POA: Diagnosis not present

## 2015-07-04 DIAGNOSIS — Z793 Long term (current) use of hormonal contraceptives: Secondary | ICD-10-CM | POA: Insufficient documentation

## 2015-07-04 DIAGNOSIS — J45901 Unspecified asthma with (acute) exacerbation: Secondary | ICD-10-CM | POA: Diagnosis not present

## 2015-07-04 DIAGNOSIS — Z7951 Long term (current) use of inhaled steroids: Secondary | ICD-10-CM | POA: Diagnosis not present

## 2015-07-04 DIAGNOSIS — R0602 Shortness of breath: Secondary | ICD-10-CM | POA: Diagnosis present

## 2015-07-04 DIAGNOSIS — Z7901 Long term (current) use of anticoagulants: Secondary | ICD-10-CM | POA: Insufficient documentation

## 2015-07-04 MED ORDER — ALBUTEROL (5 MG/ML) CONTINUOUS INHALATION SOLN
10.0000 mg/h | INHALATION_SOLUTION | RESPIRATORY_TRACT | Status: AC
  Administered 2015-07-04: 10 mg/h via RESPIRATORY_TRACT
  Filled 2015-07-04: qty 20

## 2015-07-04 MED ORDER — ALBUTEROL SULFATE (2.5 MG/3ML) 0.083% IN NEBU
5.0000 mg | INHALATION_SOLUTION | Freq: Once | RESPIRATORY_TRACT | Status: AC
  Administered 2015-07-04: 5 mg via RESPIRATORY_TRACT

## 2015-07-04 MED ORDER — METHYLPREDNISOLONE SODIUM SUCC 125 MG IJ SOLR
125.0000 mg | Freq: Once | INTRAMUSCULAR | Status: AC
  Administered 2015-07-04: 125 mg via INTRAVENOUS
  Filled 2015-07-04: qty 2

## 2015-07-04 MED ORDER — ALBUTEROL SULFATE (2.5 MG/3ML) 0.083% IN NEBU
INHALATION_SOLUTION | RESPIRATORY_TRACT | Status: AC
  Filled 2015-07-04: qty 6

## 2015-07-04 MED ORDER — MAGNESIUM SULFATE 2 GM/50ML IV SOLN
2.0000 g | Freq: Once | INTRAVENOUS | Status: AC
  Administered 2015-07-04: 2 g via INTRAVENOUS
  Filled 2015-07-04: qty 50

## 2015-07-04 NOTE — Progress Notes (Signed)
RT NOTE:  CAT started @ 2005.

## 2015-07-04 NOTE — ED Notes (Signed)
Pt states she has been being treated with high dose steroids since last July and has been having tightness in chest and pt states the states that she just the Nucala injections with some smaller reactions.  Pt sounds tight to lower lobes, but air heard in upper lobes, no obvious distress.

## 2015-07-04 NOTE — ED Notes (Signed)
Attempted to re-ass pt and was unable to locate in waiting room.

## 2015-07-04 NOTE — ED Provider Notes (Signed)
CSN: [id number]     Arrival date & time 07/04/15  1025 History  By signing my name below, I, Soijett Blue, attest that this documentation has been prepared under the direction and in the presence of Arthor CaptainAbigail Srinika Delone, PA-C Electronically Signed: Soijett Blue, ED Scribe. 07/04/2015. 7:02 PM.   Chief Complaint  Patient presents with  . Shortness of Breath      The history is provided by the patient. No language interpreter was used.    Marissa Gibson is a 40 y.o. female with a medical hx of asthma who presents to the Emergency Department complaining of SOB onset 2 weeks. Pt notes that she has been having flare ups of her asthma. Pt states that she takes nucala injections along with kenalog for her asthma that has led to allergic reactions of generalized itching. Pt notes that her last injection was 2 months ago and she had throat closing sensation that was relieved with benadryl. Pt was due to have her next nucala injection and kenalog dose prior to her vacation in [place], but she declined in case there was a reaction. Pt reports that when she came back from her vacation, her symptoms worsened due to weather changes.  Pt states that she was Rx a prednisone taper on 06/15/15 and had 80 mg kenalog on 06/22/15 and nucala and 60 mg kenalog on 06/29/15. Pt initially thought that her symptoms were relieved until she is active. Pt notes that her symptoms are worsened with ambulation and alleviated with rest. She reports that her pulmonologist Dr. Vira BrownsWendy Moore recommended her to come into the ED for further evaluation. Pt takes QVAR, Combivent, prevalite, xyzal, albuterol for her asthma.  Pt is having associated symptoms of chest tightness, cough, and wheezing. She notes that she has not tried any medications for the relief of her symptoms. She denies any other symptoms.    Past Medical History  Diagnosis Date  . Asthma   . Endometriosis   . Ovarian cyst   . Cystitis    Past Surgical History  Procedure  Laterality Date  . Cholecystectomy    . Knee surgery    . Appendectomy    . Ovarian cyst removal    . Ablation on endometriosis    . Hernia repair     No family history on file. Social History  Substance Use Topics  . Smoking status: Never Smoker   . Smokeless tobacco: None  . Alcohol Use: Yes   OB History    No data available     Review of Systems  Constitutional: Negative for fever and chills.  Respiratory: Positive for cough, chest tightness, shortness of breath and wheezing.     Allergies  Omalizumab; Amitriptyline; Cymbalta; Pseudoephedrine; Sudafed; Ceclor; Neomycin-bacitracin zn-polymyx; and Tape  Home Medications   Prior to Admission medications   Medication Sig Start Date End Date Taking? Authorizing Provider  acetaminophen (TYLENOL) 325 MG tablet Take 650 mg by mouth every 6 (six) hours as needed for mild pain.    Historical Provider, MD  albuterol (PROVENTIL HFA;VENTOLIN HFA) 108 (90 BASE) MCG/ACT inhaler Inhale 1 puff into the lungs every 6 (six) hours as needed for wheezing or shortness of breath.    Historical Provider, MD  albuterol-ipratropium (COMBIVENT) 18-103 MCG/ACT inhaler Inhale 1 puff into the lungs 4 (four) times daily.    Historical Provider, MD  B Complex-C (SUPER B COMPLEX/VITAMIN C PO) Take 1 tablet by mouth daily.    Historical Provider, MD  beclomethasone (QVAR) 80 MCG/ACT  inhaler Inhale 2 puffs into the lungs 3 (three) times daily.    Historical Provider, MD  Beclomethasone Dipropionate (QNASL) 80 MCG/ACT AERS Place 1 puff into the nose 2 (two) times daily.    Historical Provider, MD  bupivacaine (MARCAINE) 0.5 % SOLN injection 10 mLs by Infiltration route See admin instructions. 1-2 times per ay combined with Sodium Bicarbonate for bladder instillation    Historical Provider, MD  calcium-vitamin D (OSCAL WITH D) 500-200 MG-UNIT per tablet Take 1 tablet by mouth 2 (two) times daily.    Historical Provider, MD  cholestyramine light (PREVALITE) 4  GM/DOSE powder Take 4 g by mouth 2 (two) times daily with a meal.    Historical Provider, MD  CRANBERRY EXTRACT PO Take 1-2 tablets by mouth daily.    Historical Provider, MD  cycloSPORINE (RESTASIS) 0.05 % ophthalmic emulsion Place 2 drops into both eyes 2 (two) times daily.    Historical Provider, MD  EPINEPHrine (EPIPEN 2-PAK) 0.3 mg/0.3 mL IJ SOAJ injection Inject 0.3 mg into the muscle as needed (for anaphylaxis).    Historical Provider, MD  FIBER PO Take 2 tablets by mouth daily.    Historical Provider, MD  glucosamine-chondroitin 500-400 MG tablet Take 1 tablet by mouth daily.    Historical Provider, MD  guaiFENesin (MUCINEX) 600 MG 12 hr tablet Take 600 mg by mouth 2 (two) times daily as needed for cough.    Historical Provider, MD  heparin 69629 UNIT/ML injection Inject 40,000 Units into the skin See admin instructions. 1 to 2 times per day combined with bupivacaine for bladder instillation    Historical Provider, MD  HYDROcodone-acetaminophen (NORCO/VICODIN) 5-325 MG per tablet Take 1-2 tablets by mouth every 4 (four) hours as needed. 11/08/14   Kathrynn Speed, PA-C  levocetirizine (XYZAL) 5 MG tablet Take 5 mg by mouth every evening.    Historical Provider, MD  levonorgestrel (MIRENA) 20 MCG/24HR IUD 1 each by Intrauterine route once.    Historical Provider, MD  magnesium oxide (MAG-OX) 400 MG tablet Take 400 mg by mouth daily.    Historical Provider, MD  metaxalone (SKELAXIN) 800 MG tablet Take 800 mg by mouth as needed for muscle spasms.    Historical Provider, MD  Meth-Hyo-M Bl-Benz Acd-Ph Sal (HYOPHEN) 81.6 MG TABS Take 81.6 mg by mouth 2 (two) times daily.    Historical Provider, MD  Multiple Vitamin (MULTIVITAMIN WITH MINERALS) TABS tablet Take 1 tablet by mouth daily.    Historical Provider, MD  pentosan polysulfate (ELMIRON) 100 MG capsule Take 100 mg by mouth 3 (three) times daily.    Historical Provider, MD  promethazine (PHENERGAN) 25 MG tablet Take 1 tablet (25 mg total) by mouth  every 6 (six) hours as needed for nausea or vomiting. 11/08/14   Kathrynn Speed, PA-C  SODIUM BICARBONATE IV Inject 2-3 mLs into the vein See admin instructions. 1 to 2 times per day combined with bupivacaine for bladder instillation    Historical Provider, MD   BP 140/95 mmHg  Pulse 96  Temp(Src) 97.9 F (36.6 C) (Oral)  Resp 18  SpO2 98% Physical Exam Physical Exam  Constitutional: Pt appears well-developed and well-nourished. No distress.  Awake, alert, nontoxic appearance  HENT:  Head: Normocephalic and atraumatic.  Mouth/Throat: Oropharynx is clear and moist. No oropharyngeal exudate.  Eyes: Conjunctivae are normal. No scleral icterus.  Neck: Normal range of motion. Neck supple.  Cardiovascular: Normal rate, regular rhythm and intact distal pulses.   Pulmonary/Chest: Effort normal and breath  sounds normal. No respiratory distress. Pt has no wheezes.  Equal chest expansion  Abdominal: Soft. Bowel sounds are normal. Pt exhibits no mass. There is no tenderness. There is no rebound and no guarding.  Musculoskeletal: Normal range of motion. Pt exhibits no edema.  Neurological: Pt is alert.  Speech is clear and goal oriented Moves extremities without ataxia  Skin: Skin is warm and dry. Pt is not diaphoretic.  Psychiatric: Pt has a normal mood and affect.  Nursing note and vitals reviewed.  ED Course  Procedures (including critical care time) DIAGNOSTIC STUDIES: Oxygen Saturation is 98% on RA, nl by my interpretation.    COORDINATION OF CARE: 7:00 PM Discussed treatment plan with pt at bedside which includes CXR and pt agreed to plan.    Labs Review Labs Reviewed - No data to display  Imaging Review Dg Chest 2 View  07/04/2015  CLINICAL DATA:  Shortness of breath for 2 weeks EXAM: CHEST  2 VIEW COMPARISON:  None. FINDINGS: The heart size and mediastinal contours are within normal limits. Both lungs are clear. The visualized skeletal structures are unremarkable. IMPRESSION: No  active cardiopulmonary disease. Electronically Signed   By: Alcide Clever M.D.   On: 07/04/2015 13:00   I have personally reviewed and evaluated these images as part of my medical decision-making.   EKG Interpretation None      MDM   Final diagnoses:  Asthma exacerbation   Patient ambulated in ED with O2 saturations maintained >90, no current signs of respiratory distress. Lung exam improved after nebulizer treatment. Prednisone given in the ED and pt will bd dc with 5 day burst. Pt states they are breathing at baseline. Pt has been instructed to continue using prescribed medications and to speak with pulmonologist about today's exacerbation. ]   I personally performed the services described in this documentation, which was scribed in my presence. The recorded information has been reviewed and is accurate.       Arthor Captain, PA-C 07/04/15 2219  Mancel Bale, MD 07/05/15 [phone]

## 2015-07-04 NOTE — Discharge Instructions (Signed)
Bronchospasm, Adult A bronchospasm is a spasm or tightening of the airways going into the lungs. During a bronchospasm breathing becomes more difficult because the airways get smaller. When this happens there can be coughing, a whistling sound when breathing (wheezing), and difficulty breathing. Bronchospasm is often associated with asthma, but not all patients who experience a bronchospasm have asthma. CAUSES  A bronchospasm is caused by inflammation or irritation of the airways. The inflammation or irritation may be triggered by:   Allergies (such as to animals, pollen, food, or mold). Allergens that cause bronchospasm may cause wheezing immediately after exposure or many hours later.   Infection. Viral infections are believed to be the most common cause of bronchospasm.   Exercise.   Irritants (such as pollution, cigarette smoke, strong odors, aerosol sprays, and paint fumes).   Weather changes. Winds increase molds and pollens in the air. Rain refreshes the air by washing irritants out. Cold air may cause inflammation.   Stress and emotional upset.  SIGNS AND SYMPTOMS   Wheezing.   Excessive nighttime coughing.   Frequent or severe coughing with a simple cold.   Chest tightness.   Shortness of breath.  DIAGNOSIS  Bronchospasm is usually diagnosed through a history and physical exam. Tests, such as chest X-rays, are sometimes done to look for other conditions. TREATMENT   Inhaled medicines can be given to open up your airways and help you breathe. The medicines can be given using either an inhaler or a nebulizer machine.  Corticosteroid medicines may be given for severe bronchospasm, usually when it is associated with asthma. HOME CARE INSTRUCTIONS   Always have a plan prepared for seeking medical care. Know when to call your health care provider and local emergency services (911 in the [place] Know where you can access local emergency care.  Only take medicines as  directed by your health care provider.  If you were prescribed an inhaler or nebulizer machine, ask your health care provider to explain how to use it correctly. Always use a spacer with your inhaler if you were given one.  It is necessary to remain calm during an attack. Try to relax and breathe more slowly.  Control your home environment in the following ways:   Change your heating and air conditioning filter at least once a month.   Limit your use of fireplaces and wood stoves.  Do not smoke and do not allow smoking in your home.   Avoid exposure to perfumes and fragrances.   Get rid of pests (such as roaches and mice) and their droppings.   Throw away plants if you see mold on them.   Keep your house clean and dust free.   Replace carpet with wood, tile, or vinyl flooring. Carpet can trap dander and dust.   Use allergy-proof pillows, mattress covers, and box spring covers.   Wash bed sheets and blankets every week in hot water and dry them in a dryer.   Use blankets that are made of polyester or cotton.   Wash hands frequently. SEEK MEDICAL CARE IF:   You have muscle aches.   You have chest pain.   The sputum changes from clear or white to yellow, green, gray, or bloody.   The sputum you cough up gets thicker.   There are problems that may be related to the medicine you are given, such as a rash, itching, swelling, or trouble breathing.  SEEK IMMEDIATE MEDICAL CARE IF:   You have worsening wheezing and coughing  even after taking your prescribed medicines.   You have increased difficulty breathing.   You develop severe chest pain. MAKE SURE YOU:   Understand these instructions.  Will watch your condition.  Will get help right away if you are not doing well or get worse.   This information is not intended to replace advice given to you by your health care provider. Make sure you discuss any questions you have with your health care  provider.   Document Released: 03/28/2003 Document Revised: 04/15/2014 Document Reviewed: 09/14/2012 Elsevier Interactive Patient Education 2016 [employer]  Asthma, Adult Asthma is a recurring condition in which the airways tighten and narrow. Asthma can make it difficult to breathe. It can cause coughing, wheezing, and shortness of breath. Asthma episodes, also called asthma attacks, range from minor to life-threatening. Asthma cannot be cured, but medicines and lifestyle changes can help control it. CAUSES Asthma is believed to be caused by inherited (genetic) and environmental factors, but its exact cause is unknown. Asthma may be triggered by allergens, lung infections, or irritants in the air. Asthma triggers are different for each person. Common triggers include:   Animal dander.  Dust mites.  Cockroaches.  Pollen from trees or grass.  Mold.  Smoke.  Air pollutants such as dust, household cleaners, hair sprays, aerosol sprays, paint fumes, strong chemicals, or strong odors.  Cold air, weather changes, and winds (which increase molds and pollens in the air).  Strong emotional expressions such as crying or laughing hard.  Stress.  Certain medicines (such as aspirin) or types of drugs (such as beta-blockers).  Sulfites in foods and drinks. Foods and drinks that may contain sulfites include dried fruit, potato chips, and sparkling grape juice.  Infections or inflammatory conditions such as the flu, a cold, or an inflammation of the nasal membranes (rhinitis).  Gastroesophageal reflux disease (GERD).  Exercise or strenuous activity. SYMPTOMS Symptoms may occur immediately after asthma is triggered or many hours later. Symptoms include:  Wheezing.  Excessive nighttime or early morning coughing.  Frequent or severe coughing with a common cold.  Chest tightness.  Shortness of breath. DIAGNOSIS  The diagnosis of asthma is made by a review of your medical history  and a physical exam. Tests may also be performed. These may include:  Lung function studies. These tests show how much air you breathe in and out.  Allergy tests.  Imaging tests such as X-rays. TREATMENT  Asthma cannot be cured, but it can usually be controlled. Treatment involves identifying and avoiding your asthma triggers. It also involves medicines. There are 2 classes of medicine used for asthma treatment:   Controller medicines. These prevent asthma symptoms from occurring. They are usually taken every day.  Reliever or rescue medicines. These quickly relieve asthma symptoms. They are used as needed and provide short-term relief. Your health care provider will help you create an asthma action plan. An asthma action plan is a written plan for managing and treating your asthma attacks. It includes a list of your asthma triggers and how they may be avoided. It also includes information on when medicines should be taken and when their dosage should be changed. An action plan may also involve the use of a device called a peak flow meter. A peak flow meter measures how well the lungs are working. It helps you monitor your condition. HOME CARE INSTRUCTIONS   Take medicines only as directed by your health care provider. Speak with your health care provider if you have  questions about how or when to take the medicines.  Use a peak flow meter as directed by your health care provider. Record and keep track of readings.  Understand and use the action plan to help minimize or stop an asthma attack without needing to seek medical care.  Control your home environment in the following ways to help prevent asthma attacks:  Do not smoke. Avoid being exposed to secondhand smoke.  Change your heating and air conditioning filter regularly.  Limit your use of fireplaces and wood stoves.  Get rid of pests (such as roaches and mice) and their droppings.  Throw away plants if you see mold on  them.  Clean your floors and dust regularly. Use unscented cleaning products.  Try to have someone else vacuum for you regularly. Stay out of rooms while they are being vacuumed and for a short while afterward. If you vacuum, use a dust mask from a hardware store, a double-layered or microfilter vacuum cleaner bag, or a vacuum cleaner with a HEPA filter.  Replace carpet with wood, tile, or vinyl flooring. Carpet can trap dander and dust.  Use allergy-proof pillows, mattress covers, and box spring covers.  Wash bed sheets and blankets every week in hot water and dry them in a dryer.  Use blankets that are made of polyester or cotton.  Clean bathrooms and kitchens with bleach. If possible, have someone repaint the walls in these rooms with mold-resistant paint. Keep out of the rooms that are being cleaned and painted.  Wash hands frequently. SEEK MEDICAL CARE IF:   You have wheezing, shortness of breath, or a cough even if taking medicine to prevent attacks.  The colored mucus you cough up (sputum) is thicker than usual.  Your sputum changes from clear or white to yellow, green, gray, or bloody.  You have any problems that may be related to the medicines you are taking (such as a rash, itching, swelling, or trouble breathing).  You are using a reliever medicine more than 2-3 times per week.  Your peak flow is still at 50-79% of your personal best after following your action plan for 1 hour.  You have a fever. SEEK IMMEDIATE MEDICAL CARE IF:   You seem to be getting worse and are unresponsive to treatment during an asthma attack.  You are short of breath even at rest.  You get short of breath when doing very little physical activity.  You have difficulty eating, drinking, or talking due to asthma symptoms.  You develop chest pain.  You develop a fast heartbeat.  You have a bluish color to your lips or fingernails.  You are light-headed, dizzy, or faint.  Your peak flow  is less than 50% of your personal best.   This information is not intended to replace advice given to you by your health care provider. Make sure you discuss any questions you have with your health care provider.   Document Released: 03/25/2005 Document Revised: 12/14/2014 Document Reviewed: 10/22/2012 Elsevier Interactive Patient Education [employer] Inc[employer]

## 2015-07-31 ENCOUNTER — Observation Stay (HOSPITAL_COMMUNITY)
Admission: EM | Admit: 2015-07-31 | Discharge: 2015-08-03 | Disposition: A | Discharge status: Home / Self Care | Payer: BLUE CROSS/BLUE SHIELD | Attending: Internal Medicine | Admitting: Internal Medicine

## 2015-07-31 ENCOUNTER — Emergency Department (HOSPITAL_COMMUNITY): Payer: BLUE CROSS/BLUE SHIELD

## 2015-07-31 ENCOUNTER — Encounter (HOSPITAL_COMMUNITY): Payer: Self-pay | Admitting: Nurse Practitioner

## 2015-07-31 DIAGNOSIS — R000 Tachycardia, unspecified: Secondary | ICD-10-CM | POA: Diagnosis not present

## 2015-07-31 DIAGNOSIS — I10 Essential (primary) hypertension: Secondary | ICD-10-CM | POA: Insufficient documentation

## 2015-07-31 DIAGNOSIS — Z9049 Acquired absence of other specified parts of digestive tract: Secondary | ICD-10-CM | POA: Insufficient documentation

## 2015-07-31 DIAGNOSIS — R6510 Systemic inflammatory response syndrome (SIRS) of non-infectious origin without acute organ dysfunction: Secondary | ICD-10-CM | POA: Insufficient documentation

## 2015-07-31 DIAGNOSIS — E876 Hypokalemia: Secondary | ICD-10-CM | POA: Diagnosis not present

## 2015-07-31 DIAGNOSIS — J45901 Unspecified asthma with (acute) exacerbation: Secondary | ICD-10-CM

## 2015-07-31 DIAGNOSIS — R061 Stridor: Secondary | ICD-10-CM | POA: Insufficient documentation

## 2015-07-31 DIAGNOSIS — J4551 Severe persistent asthma with (acute) exacerbation: Secondary | ICD-10-CM | POA: Diagnosis not present

## 2015-07-31 DIAGNOSIS — N3010 Interstitial cystitis (chronic) without hematuria: Secondary | ICD-10-CM

## 2015-07-31 DIAGNOSIS — D72829 Elevated white blood cell count, unspecified: Secondary | ICD-10-CM

## 2015-07-31 DIAGNOSIS — R0789 Other chest pain: Secondary | ICD-10-CM | POA: Diagnosis not present

## 2015-07-31 DIAGNOSIS — J4550 Severe persistent asthma, uncomplicated: Secondary | ICD-10-CM

## 2015-07-31 LAB — BASIC METABOLIC PANEL
Anion gap: 11 (ref 5–15)
BUN: 8 mg/dL (ref 6–20)
CALCIUM: 9.3 mg/dL (ref 8.9–10.3)
CO2: 24 mmol/L (ref 22–32)
Chloride: 104 mmol/L (ref 101–111)
Creatinine, Ser: 0.7 mg/dL (ref 0.44–1.00)
GFR calc Af Amer: >60 mL/min (ref >60)
GLUCOSE: 100 mg/dL — AB (ref 65–99)
Potassium: 3.4 mmol/L — ABNORMAL LOW (ref 3.5–5.1)
Sodium: 139 mmol/L (ref 135–145)

## 2015-07-31 LAB — CBC WITH DIFFERENTIAL/PLATELET
BASOS ABS: 0 10*3/uL (ref 0.0–0.1)
Basophils Relative: 0 %
EOS ABS: 0 10*3/uL (ref 0.0–0.7)
EOS PCT: 0 %
HCT: 43.2 % (ref 36.0–46.0)
Hemoglobin: 14.3 g/dL (ref 12.0–15.0)
LYMPHS ABS: 2.1 10*3/uL (ref 0.7–4.0)
LYMPHS PCT: 15 %
MCH: 33.6 pg (ref 26.0–34.0)
MCHC: 33.1 g/dL (ref 30.0–36.0)
MCV: 101.4 fL — AB (ref 78.0–100.0)
MONO ABS: 1 10*3/uL (ref 0.1–1.0)
Monocytes Relative: 7 %
Neutro Abs: 10.7 10*3/uL — ABNORMAL HIGH (ref 1.7–7.7)
Neutrophils Relative %: 78 %
PLATELETS: 397 10*3/uL (ref 150–400)
RBC: 4.26 MIL/uL (ref 3.87–5.11)
RDW: 13.1 % (ref 11.5–15.5)
WBC: 13.7 10*3/uL — ABNORMAL HIGH (ref 4.0–10.5)

## 2015-07-31 LAB — URINALYSIS, ROUTINE W REFLEX MICROSCOPIC
BILIRUBIN URINE: NEGATIVE
Glucose, UA: 250 mg/dL — AB
Ketones, ur: 15 mg/dL — AB
Leukocytes, UA: NEGATIVE
NITRITE: NEGATIVE
Protein, ur: NEGATIVE mg/dL
SPECIFIC GRAVITY, URINE: 1.013 (ref 1.005–1.030)
pH: 5.5 (ref 5.0–8.0)

## 2015-07-31 LAB — URINE MICROSCOPIC-ADD ON

## 2015-07-31 MED ORDER — ADULT MULTIVITAMIN W/MINERALS CH
1.0000 | ORAL_TABLET | Freq: Every day | ORAL | Status: DC
  Administered 2015-08-01 – 2015-08-03 (×3): 1 via ORAL
  Filled 2015-07-31 (×3): qty 1

## 2015-07-31 MED ORDER — ALBUTEROL (5 MG/ML) CONTINUOUS INHALATION SOLN
10.0000 mg/h | INHALATION_SOLUTION | RESPIRATORY_TRACT | Status: DC
  Administered 2015-08-01: 10 mg/h via RESPIRATORY_TRACT
  Filled 2015-07-31: qty 20

## 2015-07-31 MED ORDER — AMLODIPINE BESYLATE 5 MG PO TABS
5.0000 mg | ORAL_TABLET | Freq: Every day | ORAL | Status: DC
  Administered 2015-08-01 – 2015-08-03 (×4): 5 mg via ORAL
  Filled 2015-07-31 (×4): qty 1

## 2015-07-31 MED ORDER — PENTOSAN POLYSULFATE SODIUM 100 MG PO CAPS
100.0000 mg | ORAL_CAPSULE | Freq: Three times a day (TID) | ORAL | Status: DC
  Administered 2015-08-01 – 2015-08-03 (×8): 100 mg via ORAL
  Filled 2015-07-31 (×10): qty 1

## 2015-07-31 MED ORDER — RACEPINEPHRINE HCL 2.25 % IN NEBU: 0.5000 mL | INHALATION_SOLUTION | Freq: Once | RESPIRATORY_TRACT | Status: DC

## 2015-07-31 MED ORDER — RACEPINEPHRINE HCL 2.25 % IN NEBU
0.5000 mL | INHALATION_SOLUTION | Freq: Once | RESPIRATORY_TRACT | Status: AC
  Administered 2015-07-31: 0.5 mL via RESPIRATORY_TRACT
  Filled 2015-07-31: qty 0.5

## 2015-07-31 MED ORDER — ACETAMINOPHEN 325 MG PO TABS: 650.0000 mg | ORAL_TABLET | Freq: Four times a day (QID) | ORAL | Status: DC | PRN

## 2015-07-31 MED ORDER — CYCLOSPORINE 0.05 % OP EMUL
2.0000 [drp] | Freq: Two times a day (BID) | OPHTHALMIC | Status: DC
  Administered 2015-08-01 – 2015-08-03 (×5): 2 [drp] via OPHTHALMIC
  Filled 2015-07-31 (×5): qty 1

## 2015-07-31 MED ORDER — MAGNESIUM SULFATE 2 GM/50ML IV SOLN
2.0000 g | Freq: Once | INTRAVENOUS | Status: AC
  Administered 2015-07-31: 2 g via INTRAVENOUS
  Filled 2015-07-31: qty 50

## 2015-07-31 MED ORDER — UROGESIC-BLUE 81.6 MG PO TABS
1.0000 | ORAL_TABLET | Freq: Three times a day (TID) | ORAL | Status: DC | PRN
  Administered 2015-08-01: 81.6 mg via ORAL
  Filled 2015-07-31 (×2): qty 1

## 2015-07-31 MED ORDER — IPRATROPIUM BROMIDE 0.06 % NA SOLN
1.0000 | Freq: Two times a day (BID) | NASAL | Status: DC
Start: 2015-07-31 — End: 2015-08-03
  Administered 2015-08-01 – 2015-08-03 (×6): 1 via NASAL
  Filled 2015-07-31: qty 15

## 2015-07-31 MED ORDER — HYDROCODONE-ACETAMINOPHEN 5-325 MG PO TABS: 1.0000 | ORAL_TABLET | Freq: Four times a day (QID) | ORAL | Status: DC | PRN

## 2015-07-31 MED ORDER — ALBUTEROL (5 MG/ML) CONTINUOUS INHALATION SOLN
10.0000 mg/h | INHALATION_SOLUTION | RESPIRATORY_TRACT | Status: DC
  Administered 2015-07-31: 10 mg/h via RESPIRATORY_TRACT
  Filled 2015-07-31: qty 20

## 2015-07-31 MED ORDER — RISAQUAD PO CAPS
1.0000 | ORAL_CAPSULE | Freq: Every day | ORAL | Status: DC
Start: 2015-07-31 — End: 2015-08-03
  Administered 2015-08-01 – 2015-08-03 (×3): 1 via ORAL
  Filled 2015-07-31 (×4): qty 1

## 2015-07-31 MED ORDER — IPRATROPIUM BROMIDE 0.02 % IN SOLN
0.5000 mg | Freq: Once | RESPIRATORY_TRACT | Status: AC
  Administered 2015-08-01: 0.5 mg via RESPIRATORY_TRACT
  Filled 2015-07-31: qty 2.5

## 2015-07-31 MED ORDER — HEPARIN SODIUM (PORCINE) 10000 UNIT/ML IJ SOLN: 40000.0000 [IU] | INTRAMUSCULAR | Status: DC

## 2015-07-31 MED ORDER — CALCIUM CARBONATE-VITAMIN D 500-200 MG-UNIT PO TABS
1.0000 | ORAL_TABLET | Freq: Every day | ORAL | Status: DC
  Administered 2015-08-01 – 2015-08-03 (×3): 1 via ORAL
  Filled 2015-07-31 (×3): qty 1

## 2015-07-31 MED ORDER — LORATADINE 10 MG PO TABS
10.0000 mg | ORAL_TABLET | Freq: Every morning | ORAL | Status: DC
Start: 2015-08-01 — End: 2015-08-03
  Administered 2015-08-01 – 2015-08-03 (×3): 10 mg via ORAL
  Filled 2015-07-31 (×3): qty 1

## 2015-07-31 MED ORDER — CLOTRIMAZOLE 1 % VA CREA
1.0000 | TOPICAL_CREAM | Freq: Every day | VAGINAL | Status: DC
  Administered 2015-08-01 – 2015-08-02 (×3): 1 via VAGINAL
  Filled 2015-07-31: qty 45

## 2015-07-31 MED ORDER — GLUCOSAMINE-CHONDROITIN 500-400 MG PO TABS: 1.0000 | ORAL_TABLET | Freq: Every day | ORAL | Status: DC

## 2015-07-31 MED ORDER — ONDANSETRON HCL 4 MG/2ML IJ SOLN: 4.0000 mg | Freq: Four times a day (QID) | INTRAMUSCULAR | Status: DC | PRN

## 2015-07-31 MED ORDER — MAGNESIUM OXIDE 400 (241.3 MG) MG PO TABS
400.0000 mg | ORAL_TABLET | Freq: Every day | ORAL | Status: DC
  Administered 2015-08-01 – 2015-08-03 (×4): 400 mg via ORAL
  Filled 2015-07-31 (×4): qty 1

## 2015-07-31 MED ORDER — CHOLESTYRAMINE 4 G PO PACK
4.0000 g | PACK | Freq: Two times a day (BID) | ORAL | Status: DC
  Administered 2015-08-01 – 2015-08-03 (×5): 4 g via ORAL
  Filled 2015-07-31 (×6): qty 1

## 2015-07-31 MED ORDER — FAMOTIDINE IN NACL 20-0.9 MG/50ML-% IV SOLN
20.0000 mg | Freq: Once | INTRAVENOUS | Status: AC
  Administered 2015-07-31: 20 mg via INTRAVENOUS
  Filled 2015-07-31: qty 50

## 2015-07-31 MED ORDER — LEVALBUTEROL HCL 0.63 MG/3ML IN NEBU: 0.6300 mg | INHALATION_SOLUTION | RESPIRATORY_TRACT | Status: DC

## 2015-07-31 MED ORDER — LOSARTAN POTASSIUM 50 MG PO TABS
50.0000 mg | ORAL_TABLET | Freq: Every day | ORAL | Status: DC
  Administered 2015-08-01 – 2015-08-03 (×3): 50 mg via ORAL
  Filled 2015-07-31 (×3): qty 1

## 2015-07-31 MED ORDER — ACETAMINOPHEN 650 MG RE SUPP: 650.0000 mg | Freq: Four times a day (QID) | RECTAL | Status: DC | PRN

## 2015-07-31 MED ORDER — FLUTICASONE PROPIONATE 50 MCG/ACT NA SUSP
1.0000 | Freq: Every day | NASAL | Status: DC
  Administered 2015-08-01 – 2015-08-03 (×3): 1 via NASAL
  Filled 2015-07-31: qty 16

## 2015-07-31 MED ORDER — IPRATROPIUM-ALBUTEROL 0.5-2.5 (3) MG/3ML IN SOLN
3.0000 mL | Freq: Once | RESPIRATORY_TRACT | Status: AC
  Administered 2015-07-31: 3 mL via RESPIRATORY_TRACT
  Filled 2015-07-31: qty 3

## 2015-07-31 MED ORDER — GUAIFENESIN ER 600 MG PO TB12: 600.0000 mg | ORAL_TABLET | Freq: Two times a day (BID) | ORAL | Status: DC | PRN

## 2015-07-31 MED ORDER — DIPHENHYDRAMINE HCL 50 MG/ML IJ SOLN
25.0000 mg | Freq: Once | INTRAMUSCULAR | Status: AC
  Administered 2015-07-31: 25 mg via INTRAVENOUS
  Filled 2015-07-31: qty 1

## 2015-07-31 MED ORDER — LEVALBUTEROL HCL 1.25 MG/0.5ML IN NEBU
1.2500 mg | INHALATION_SOLUTION | RESPIRATORY_TRACT | Status: AC
Start: 2015-07-31 — End: 2015-07-31
  Administered 2015-07-31: 1.25 mg via RESPIRATORY_TRACT
  Filled 2015-07-31: qty 0.5

## 2015-07-31 MED ORDER — ONDANSETRON HCL 4 MG PO TABS: 4.0000 mg | ORAL_TABLET | Freq: Four times a day (QID) | ORAL | Status: DC | PRN

## 2015-07-31 MED ORDER — IPRATROPIUM BROMIDE 0.02 % IN SOLN
0.5000 mg | Freq: Once | RESPIRATORY_TRACT | Status: AC
  Administered 2015-07-31: 0.5 mg via RESPIRATORY_TRACT
  Filled 2015-07-31: qty 2.5

## 2015-07-31 MED ORDER — BUPIVACAINE HCL (PF) 0.5 % IJ SOLN: 10.0000 mL | INTRAMUSCULAR | Status: DC

## 2015-07-31 MED ORDER — METHYLPREDNISOLONE SODIUM SUCC 125 MG IJ SOLR
125.0000 mg | Freq: Once | INTRAMUSCULAR | Status: AC
  Administered 2015-07-31: 125 mg via INTRAVENOUS
  Filled 2015-07-31: qty 2

## 2015-07-31 NOTE — ED Notes (Addendum)
Pt states she feels her breathing is becoming more difficult.  Respirations assessed, wheezing and diminished lung sounds noted.  Pt 95% on RA at this time.  Will page admitting physician.

## 2015-07-31 NOTE — ED Notes (Signed)
Attempted report x1. 

## 2015-07-31 NOTE — H&P (Signed)
History and Physical    Almas Rake ZOX:[id number] DOB: 1975-11-25 DOA: 07/31/2015  Referring MD/NP/PA:Dr. Manus Gunning PCP: Thora Lance, MD  Outpatient Specialists: Dr. Vira Browns, Pulmonology [facility] Patient coming from: Home   Chief Complaint: Shortness of breath  HPI: Marissa Gibson is a 40 y.o. female with medical history significant of severe asthma, HTN, interstitial cystitis, allergies; who presents with complaints of shortness breath. She has severe asthma that is not well controlled despite use of Kenalog injections, albuterol, Qvar, Tiotropium-Olodaterol, and immune modulating medications.  Her current presenting symptoms started after taking her last Nucola injection 4 days ago(4/20). She receives the injections once a month and this is the fifth dose that she has taken. Last month after taking the Nucola injection she states she had acute asthma exacerbation for which she believes was an allergic reaction to this medicine. Initially, after taking medicine most recently she reports having itching and chest tightness. She had to receive a breathing treatment while in the office. She went home and took Benadryl and was fine up until 2 days later when she developed worsening shortness of breath with wheezing. She reports taking total of 7. Treatments between 4 PM and 12 midnight. Subsequently, the following day similarly she took anywhere from 6-7 breathing treatments throughout the day. Today symptoms persisted and she states she just felt like her shortness of breath was getting worse she tried 5 breathing treatments without relief of symptoms and therefore came to the hospital for further evaluation. She notes that she has pretty complicated asthma    ED Course: Upon admission to the emergency department patient was seen to be afebrile, heart rates up to 132, respirations up to 26, blood pressures within normal limits, and O2 saturations maintained on room air. She was given  continuous DuoNeb breathing treatments, 125 mg of Solu-Medrol, and magnesium sulfate with persistence of shortness of breath and wheezing.  Review of Systems: As per HPI otherwise 10 point review of systems negative.    Past Medical History  Diagnosis Date  . Asthma   . Endometriosis   . Ovarian cyst   . Cystitis     Past Surgical History  Procedure Laterality Date  . Cholecystectomy    . Knee surgery    . Appendectomy    . Ovarian cyst removal    . Ablation on endometriosis    . Hernia repair       reports that she has never smoked. She does not have any smokeless tobacco history on file. She reports that she drinks alcohol. She reports that she does not use illicit drugs.  Allergies  Allergen Reactions  . 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 Other Nausea And Vomiting    anesthesia-severe vomiting with and after sedation Cats, dogs, ragweed, mold, cockroaches, dust, grass pollens, perfumes and polyester-unspecified allergens  . Xolair [Omalizumab] Hives  . Amitriptyline Other (See Comments)    Other reaction(s): GI Upset (intolerance)  . Duloxetine Nausea And Vomiting and Other (See Comments)    Headaches, nausea & vomiting, vertigo  . Ibuprofen Other (See Comments)    Limit ibuprofen type of meds-per MD  . Nsaids Other (See Comments)    Limit use of NSAIDS-per MD  . Lyman Bishop [Pseudoephedrine Hcl] Other (See Comments)    lethargic  . Tape Rash    Steri-strips, bandaids, etc  . Ceclor [Cefaclor] Rash  . Cymbalta [Duloxetine Hcl] Other (See Comments)  . Neomycin-Bacitracin Zn-Polymyx  Rash    History reviewed. No pertinent family history.  Prior to Admission medications   Medication Sig Start Date End Date Taking? Authorizing Provider  acetaminophen (TYLENOL) 325 MG tablet Take 650 mg by mouth every 6 (six) hours as needed for mild pain.   Yes Historical Provider, MD  albuterol (PROVENTIL  HFA;VENTOLIN HFA) 108 (90 BASE) MCG/ACT inhaler Inhale 1 puff into the lungs every 6 (six) hours as needed for wheezing or shortness of breath.   Yes Historical Provider, MD  albuterol (PROVENTIL) (2.5 MG/3ML) 0.083% nebulizer solution Take 2.5 mg by nebulization every 8 (eight) hours as needed for wheezing or shortness of breath.   Yes Historical Provider, MD  amLODipine (NORVASC) 5 MG tablet Take 5 mg by mouth daily.   Yes Historical Provider, MD  B Complex-C (SUPER B COMPLEX/VITAMIN C PO) Take 1 tablet by mouth daily.   Yes Historical Provider, MD  beclomethasone (QVAR) 80 MCG/ACT inhaler Inhale 2 puffs into the lungs 3 (three) times daily.   Yes Historical Provider, MD  Beclomethasone Dipropionate (QNASL) 80 MCG/ACT AERS Place 1 spray into the nose 2 (two) times daily.    Yes Historical Provider, MD  bupivacaine (MARCAINE) 0.5 % SOLN injection 10 mLs by Infiltration route See admin instructions. 2-3 times per day combined with Sodium Bicarbonate for bladder instillation   Yes Historical Provider, MD  calcium-vitamin D (OSCAL WITH D) 500-200 MG-UNIT per tablet Take 1 tablet by mouth daily with breakfast.    Yes Historical Provider, MD  cholestyramine (QUESTRAN) 4 g packet Take 4 g by mouth 2 (two) times daily.   Yes Historical Provider, MD  CRANBERRY EXTRACT PO Take 1 tablet by mouth daily.    Yes Historical Provider, MD  cycloSPORINE (RESTASIS) 0.05 % ophthalmic emulsion Place 2 drops into both eyes 2 (two) times daily.   Yes Historical Provider, MD  estradiol (CLIMARA - DOSED IN MG/24 HR) 0.0375 mg/24hr patch Place 0.0375 mg onto the skin every Sunday.    Yes Historical Provider, MD  FIBER PO Take 2 tablets by mouth daily.   Yes Historical Provider, MD  glucosamine-chondroitin (COSAMIN DS) 500-400 MG tablet Take 1 tablet by mouth daily.   Yes Historical Provider, MD  guaiFENesin (MUCINEX) 600 MG 12 hr tablet Take 600 mg by mouth 2 (two) times daily as needed for cough.   Yes Historical Provider,  MD  heparin 78469 UNIT/ML injection Inject 40,000 Units into the skin See admin instructions. 2-3 times per day combined with bupivacaine for bladder instillation   Yes Historical Provider, MD  HYDROcodone-acetaminophen (NORCO/VICODIN) 5-325 MG tablet Take 1 tablet by mouth every 6 (six) hours as needed for moderate pain.   Yes Historical Provider, MD  ipratropium (ATROVENT) 0.06 % nasal spray Place 1 spray into both nostrils 2 (two) times daily.    Yes Historical Provider, MD  Ipratropium-Albuterol (COMBIVENT RESPIMAT) 20-100 MCG/ACT AERS respimat Inhale 1 puff into the lungs every 6 (six) hours as needed for wheezing or shortness of breath.   Yes Historical Provider, MD  levocetirizine (XYZAL) 5 MG tablet Take 5 mg by mouth every morning.    Yes Historical Provider, MD  levonorgestrel (MIRENA) 20 MCG/24HR IUD 1 each by Intrauterine route once.   Yes Historical Provider, MD  losartan (COZAAR) 50 MG tablet Take 50 mg by mouth daily.   Yes Historical Provider, MD  magnesium oxide (MAG-OX) 400 MG tablet Take 400 mg by mouth daily.   Yes Historical Provider, MD  Mepolizumab (NUCALA) 100 MG  SOLR Inject 100 mg into the skin every 30 (thirty) days.   Yes Historical Provider, MD  metaxalone (SKELAXIN) 800 MG tablet Take 800 mg by mouth as needed for muscle spasms.   Yes Historical Provider, MD  Methen-Hyosc-Meth Blue-Na Phos (UROGESIC-BLUE) 81.6 MG TABS Take 1 tablet by mouth See admin instructions. Takes 2-3 times daily   Yes Historical Provider, MD  Multiple Vitamin (MULTIVITAMIN WITH MINERALS) TABS tablet Take 1 tablet by mouth daily.   Yes Historical Provider, MD  pentosan polysulfate (ELMIRON) 100 MG capsule Take 100 mg by mouth 3 (three) times daily.   Yes Historical Provider, MD  Probiotic Product (PROBIOTIC FORMULA) CAPS Take 1 capsule by mouth daily.   Yes Historical Provider, MD  SODIUM BICARBONATE IV Inject 2-3 mLs into the vein See admin instructions. 2-3 times per day combined with bupivacaine  for bladder instillation   Yes Historical Provider, MD  terconazole (TERAZOL 7) 0.4 % vaginal cream Place 1 applicator vaginally at bedtime. Started 4/23 for 7 days ending 4/30   Yes Historical Provider, MD  Tiotropium Bromide-Olodaterol (STIOLTO RESPIMAT) 2.5-2.5 MCG/ACT AERS Inhale 1 puff into the lungs 2 (two) times daily.   Yes Historical Provider, MD  Triamcinolone Acetonide (KENALOG IJ) Inject 80 mg as directed every 21 ( twenty-one) days.   Yes Historical Provider, MD  zileuton (ZYFLO CR) 600 MG CR tablet Take 1,200 mg by mouth 2 (two) times daily.   Yes Historical Provider, MD  EPINEPHrine (EPIPEN 2-PAK) 0.3 mg/0.3 mL IJ SOAJ injection Inject 0.3 mg into the muscle as needed (for anaphylaxis).    Historical Provider, MD    Physical Exam: Filed Vitals:   07/31/15 1723 07/31/15 1740 07/31/15 1930 07/31/15 2123  BP:   130/68 136/86  Pulse:   132 124  Temp:      TempSrc:      Resp:   21 26  SpO2: 98% 98% 98% 95%      Constitutional: NAD, calm, comfortable Filed Vitals:   07/31/15 1723 07/31/15 1740 07/31/15 1930 07/31/15 2123  BP:   130/68 136/86  Pulse:   132 124  Temp:      TempSrc:      Resp:   21 26  SpO2: 98% 98% 98% 95%   Eyes: PERRL, lids and conjunctivae normal ENMT: Mucous membranes are moist. Posterior pharynx clear of any exudate or lesions.Normal dentition.  Neck: normal, supple, no masses, no thyromegaly. Inspiratory stridor Respiratory:  Tachypneic, decreased overall aeration, and scattered expiratory wheezes appreciated. Patient able to talk in full sentences. Cardiovascular: Regular rate and rhythm, no murmurs / rubs / gallops. No extremity edema. 2+ pedal pulses. No carotid bruits.  Abdomen: no tenderness, no masses palpated. No hepatosplenomegaly. Bowel sounds positive.  Musculoskeletal: no clubbing / cyanosis. No joint deformity upper and lower extremities. Good ROM, no contractures. Normal muscle tone.  Skin: no rashes, lesions, ulcers. No  induration Neurologic: CN 2-12 grossly intact. Sensation intact, DTR normal. Strength 5/5 in all 4.  Psychiatric: Normal judgment and insight. Alert and oriented x 3. Normal mood.     Labs on Admission: I have personally reviewed following labs and imaging studies  CBC:  Recent Labs Lab 07/31/15 1527  WBC 13.7*  NEUTROABS 10.7*  HGB 14.3  HCT 43.2  MCV 101.4*  PLT 397   Basic Metabolic Panel:  Recent Labs Lab 07/31/15 1527  NA 139  K 3.4*  CL 104  CO2 24  GLUCOSE 100*  BUN 8  CREATININE 0.70  CALCIUM  9.3   GFR: CrCl cannot be calculated (Unknown ideal weight.). Liver Function Tests: No results for input(s): AST, ALT, ALKPHOS, BILITOT, PROT, ALBUMIN in the last 168 hours. No results for input(s): LIPASE, AMYLASE in the last 168 hours. No results for input(s): AMMONIA in the last 168 hours. Coagulation Profile: No results for input(s): INR, PROTIME in the last 168 hours. Cardiac Enzymes: No results for input(s): CKTOTAL, CKMB, CKMBINDEX, TROPONINI in the last 168 hours. BNP (last 3 results) No results for input(s): PROBNP in the last 8760 hours. HbA1C: No results for input(s): HGBA1C in the last 72 hours. CBG: No results for input(s): GLUCAP in the last 168 hours. Lipid Profile: No results for input(s): CHOL, HDL, LDLCALC, TRIG, CHOLHDL, LDLDIRECT in the last 72 hours. Thyroid Function Tests: No results for input(s): TSH, T4TOTAL, FREET4, T3FREE, THYROIDAB in the last 72 hours. Anemia Panel: No results for input(s): VITAMINB12, FOLATE, FERRITIN, TIBC, IRON, RETICCTPCT in the last 72 hours. Urine analysis:    Component Value Date/Time   COLORURINE YELLOW 11/08/2014 0551   APPEARANCEUR CLEAR 11/08/2014 0551   LABSPEC 1.005 11/08/2014 0551   PHURINE 6.5 11/08/2014 0551   GLUCOSEU NEGATIVE 11/08/2014 0551   HGBUR NEGATIVE 11/08/2014 0551   BILIRUBINUR NEGATIVE 11/08/2014 0551   KETONESUR NEGATIVE 11/08/2014 0551   PROTEINUR NEGATIVE 11/08/2014 0551    UROBILINOGEN 0.2 11/08/2014 0551   NITRITE NEGATIVE 11/08/2014 0551   LEUKOCYTESUR NEGATIVE 11/08/2014 0551   Sepsis Labs: No results found for this or any previous visit (from the past 240 hour(s)).   Radiological Exams on Admission: Dg Neck Soft Tissue  07/31/2015  CLINICAL DATA:  Worsening asthma over the past 4 days. EXAM: NECK SOFT TISSUES - 1+ VIEW COMPARISON:  None. FINDINGS: Normal appearing airway and epiglottis. Mild reversal of the normal cervical lordosis and mild multilevel cervical spine degenerative changes. IMPRESSION: No acute abnormality. Electronically Signed   By: Beckie Salts M.D.   On: 07/31/2015 18:17   Dg Chest Portable 1 View  07/31/2015  CLINICAL DATA:  Shortness of breath. EXAM: PORTABLE CHEST 1 VIEW COMPARISON:  July 04, 2015. FINDINGS: The heart size and mediastinal contours are within normal limits. Both lungs are clear. No pneumothorax or pleural effusion is noted. The visualized skeletal structures are unremarkable. IMPRESSION: No acute cardiopulmonary abnormality seen. Electronically Signed   By: Lupita Raider, M.D.   On: 07/31/2015 15:25    EKG: Independently reviewed. Sinus tachycardia with borderline right axis deviation  Assessment/Plan Asthma exacerbation: Acute. Patient appears to have pretty severe asthma given history and current medication regimen. On physical exam patient noted to have inspiratory wheezing present of the upper neck suggestive of stridor although able to talk in full sentences. In the differential is the possibility of vocal cord dysfunction or some upper airway obstruction Due to the refractory nature of symptoms that the patient is having. - Admit to MedSurg bed - Checking d-dimer - DuoNebs  - Methylprednisolone - Nasal cannula oxygen if needed to keep O2 sats greater than 92% - Consult pulmonary for further evaluation  Leukocytosis: Initial WBC 13.7. Note patient recently has received Kenalog injections. Question if the  leukocytosis secondary to underlying infection. UA and chest x-ray appeared to be negative for any signs of infection. - Continue to monitor recheck CBC in a.m.  Sinus tachycardia: Acute on chronic. Patient notes baseline heart rate in the low 100's. - Continue to monitor  Essential hypertension - Continue losartan and amlodipine  Hypokalemia: Acute. Potassium 3.4 on admission. -  Giving 20 mEq of potassium chloride  Interstitial cystitis: Stable - continue pentosan - Continue current home regimen  DVT prophylaxis: Lovenox Code Status: Full Family Communication:   Disposition Plan: Expected discharge home in 1-2 days Consults called: Pulmonary critical care Admission status: observation med-surg   Clydie Braunondell A Smith MD [facility] Pager [phone]- [phone]  If 7PM-7AM, please contact night-coverage [email or web address] Password [email or web address]  07/31/2015, 9:25 PM

## 2015-07-31 NOTE — ED Notes (Signed)
Pt reports worsening asthma over the past 4 days. She has been to her doctor for this and given steroid injection on Thursday and despite using her nebs continues to et worse. She is SOB with audible wheezing.

## 2015-07-31 NOTE — ED Notes (Signed)
Respiratory reminded of need for hour long neb

## 2015-07-31 NOTE — ED Notes (Signed)
Pt ambulated to rr

## 2015-07-31 NOTE — ED Notes (Signed)
Respiratory at bedside.

## 2015-07-31 NOTE — ED Notes (Signed)
Pt transported to xray 

## 2015-07-31 NOTE — ED Notes (Signed)
Ambulated pt in hallway to restroom. Pt's O2 started at 96% and dropped to 93% while ambulating. Picked back up to 96% amd pt is now at 99% at rest in bed.

## 2015-07-31 NOTE — Progress Notes (Signed)
Received report from ED RN, Manning CharityKatlynne.

## 2015-07-31 NOTE — ED Notes (Signed)
Made respiratory aware of need for hour long nebulization for pt.  MD wanting Racepinephrine, Albuterol and Atrovent.  RT to speak to MD and come to visit pt.

## 2015-07-31 NOTE — ED Provider Notes (Signed)
CSN: [id number]     Arrival date & time 07/31/15  1455 History   First MD Initiated Contact with Patient 07/31/15 1506     Chief Complaint  Patient presents with  . Asthma     (Consider location/radiation/quality/duration/timing/severity/associated sxs/prior Treatment) HPI Comments: Patient with history of asthma presenting with shortness of breath worsening over the past 4 days. She saw her pulmonologist on April 20 and received an injection of nucala which she suspects she is allergic to. She was given a nebulizer in the office and then felt better. She is on "controlling her breathing" with nebulizers at home over the past several days. She is nebulizer today about 5 times without relief. She denies any chest pain, cough, sore throat or runny nose. She denies any generalized itching. The patient was seen in the emergency department approximately one month ago with similar symptoms. She also received 80 mg of Kenalog in the pulmonology office on April 20. Her symptoms are worse with ambulation. She sees Dr. Vira Browns in [place] for her asthma. Denies any leg pain or leg swelling. No abdominal pain, nausea or vomiting.   Patient is a 40 y.o. female presenting with asthma. The history is provided by the patient. The history is limited by the condition of the patient.  Asthma Associated symptoms include shortness of breath. Pertinent negatives include no chest pain, no abdominal pain and no headaches.    Past Medical History  Diagnosis Date  . Asthma   . Endometriosis   . Ovarian cyst   . Cystitis    Past Surgical History  Procedure Laterality Date  . Cholecystectomy    . Knee surgery    . Appendectomy    . Ovarian cyst removal    . Ablation on endometriosis    . Hernia repair     History reviewed. No pertinent family history. Social History  Substance Use Topics  . Smoking status: Never Smoker   . Smokeless tobacco: None  . Alcohol Use: Yes   OB History    No data  available     Review of Systems  Constitutional: Positive for activity change and appetite change. Negative for fever.  HENT: Negative for congestion.   Eyes: Negative for visual disturbance.  Respiratory: Positive for cough and shortness of breath.   Cardiovascular: Negative for chest pain and leg swelling.  Gastrointestinal: Negative for nausea, vomiting and abdominal pain.  Genitourinary: Negative for dysuria, hematuria, vaginal bleeding and vaginal discharge.  Musculoskeletal: Negative for myalgias and arthralgias.  Skin: Negative for rash.  Neurological: Negative for dizziness, weakness and headaches.  A complete 10 system review of systems was obtained and all systems are negative except as noted in the HPI and PMH.      Allergies  Allspice; Other; Xolair; Amitriptyline; Duloxetine; Ibuprofen; Nsaids; Sudafed; Tape; Ceclor; Cymbalta; and Neomycin-bacitracin zn-polymyx  Home Medications   Prior to Admission medications   Medication Sig Start Date End Date Taking? Authorizing Provider  acetaminophen (TYLENOL) 325 MG tablet Take 650 mg by mouth every 6 (six) hours as needed for mild pain.   Yes Historical Provider, MD  albuterol (PROVENTIL HFA;VENTOLIN HFA) 108 (90 BASE) MCG/ACT inhaler Inhale 1 puff into the lungs every 6 (six) hours as needed for wheezing or shortness of breath.   Yes Historical Provider, MD  albuterol (PROVENTIL) (2.5 MG/3ML) 0.083% nebulizer solution Take 2.5 mg by nebulization every 8 (eight) hours as needed for wheezing or shortness of breath.   Yes Historical Provider, MD  amLODipine (NORVASC) 5 MG tablet Take 5 mg by mouth daily.   Yes Historical Provider, MD  B Complex-C (SUPER B COMPLEX/VITAMIN C PO) Take 1 tablet by mouth daily.   Yes Historical Provider, MD  beclomethasone (QVAR) 80 MCG/ACT inhaler Inhale 2 puffs into the lungs 3 (three) times daily.   Yes Historical Provider, MD  Beclomethasone Dipropionate (QNASL) 80 MCG/ACT AERS Place 1 spray into  the nose 2 (two) times daily.    Yes Historical Provider, MD  bupivacaine (MARCAINE) 0.5 % SOLN injection 10 mLs by Infiltration route See admin instructions. 2-3 times per day combined with Sodium Bicarbonate for bladder instillation   Yes Historical Provider, MD  calcium-vitamin D (OSCAL WITH D) 500-200 MG-UNIT per tablet Take 1 tablet by mouth daily with breakfast.    Yes Historical Provider, MD  cholestyramine (QUESTRAN) 4 g packet Take 4 g by mouth 2 (two) times daily.   Yes Historical Provider, MD  CRANBERRY EXTRACT PO Take 1 tablet by mouth daily.    Yes Historical Provider, MD  cycloSPORINE (RESTASIS) 0.05 % ophthalmic emulsion Place 2 drops into both eyes 2 (two) times daily.   Yes Historical Provider, MD  estradiol (CLIMARA - DOSED IN MG/24 HR) 0.0375 mg/24hr patch Place 0.0375 mg onto the skin every Sunday.    Yes Historical Provider, MD  FIBER PO Take 2 tablets by mouth daily.   Yes Historical Provider, MD  glucosamine-chondroitin (COSAMIN DS) 500-400 MG tablet Take 1 tablet by mouth daily.   Yes Historical Provider, MD  guaiFENesin (MUCINEX) 600 MG 12 hr tablet Take 600 mg by mouth 2 (two) times daily as needed for cough.   Yes Historical Provider, MD  heparin [id number] UNIT/ML injection Inject 40,000 Units into the skin See admin instructions. 2-3 times per day combined with bupivacaine for bladder instillation   Yes Historical Provider, MD  HYDROcodone-acetaminophen (NORCO/VICODIN) 5-325 MG tablet Take 1 tablet by mouth every 6 (six) hours as needed for moderate pain.   Yes Historical Provider, MD  ipratropium (ATROVENT) 0.06 % nasal spray Place 1 spray into both nostrils 2 (two) times daily.    Yes Historical Provider, MD  Ipratropium-Albuterol (COMBIVENT RESPIMAT) 20-100 MCG/ACT AERS respimat Inhale 1 puff into the lungs every 6 (six) hours as needed for wheezing or shortness of breath.   Yes Historical Provider, MD  levocetirizine (XYZAL) 5 MG tablet Take 5 mg by mouth every morning.     Yes Historical Provider, MD  levonorgestrel (MIRENA) 20 MCG/24HR IUD 1 each by Intrauterine route once.   Yes Historical Provider, MD  losartan (COZAAR) 50 MG tablet Take 50 mg by mouth daily.   Yes Historical Provider, MD  magnesium oxide (MAG-OX) 400 MG tablet Take 400 mg by mouth daily.   Yes Historical Provider, MD  Mepolizumab (NUCALA) 100 MG SOLR Inject 100 mg into the skin every 30 (thirty) days.   Yes Historical Provider, MD  metaxalone (SKELAXIN) 800 MG tablet Take 800 mg by mouth as needed for muscle spasms.   Yes Historical Provider, MD  Methen-Hyosc-Meth Blue-Na Phos (UROGESIC-BLUE) 81.6 MG TABS Take 1 tablet by mouth See admin instructions. Takes 2-3 times daily   Yes Historical Provider, MD  Multiple Vitamin (MULTIVITAMIN WITH MINERALS) TABS tablet Take 1 tablet by mouth daily.   Yes Historical Provider, MD  pentosan polysulfate (ELMIRON) 100 MG capsule Take 100 mg by mouth 3 (three) times daily.   Yes Historical Provider, MD  Probiotic Product (PROBIOTIC FORMULA) CAPS Take 1 capsule by mouth  daily.   Yes Historical Provider, MD  SODIUM BICARBONATE IV Inject 2-3 mLs into the vein See admin instructions. 2-3 times per day combined with bupivacaine for bladder instillation   Yes Historical Provider, MD  terconazole (TERAZOL 7) 0.4 % vaginal cream Place 1 applicator vaginally at bedtime. Started 4/23 for 7 days ending 4/30   Yes Historical Provider, MD  Tiotropium Bromide-Olodaterol (STIOLTO RESPIMAT) 2.5-2.5 MCG/ACT AERS Inhale 1 puff into the lungs 2 (two) times daily.   Yes Historical Provider, MD  Triamcinolone Acetonide (KENALOG IJ) Inject 80 mg as directed every 21 ( twenty-one) days.   Yes Historical Provider, MD  zileuton (ZYFLO CR) 600 MG CR tablet Take 1,200 mg by mouth 2 (two) times daily.   Yes Historical Provider, MD  EPINEPHrine (EPIPEN 2-PAK) 0.3 mg/0.3 mL IJ SOAJ injection Inject 0.3 mg into the muscle as needed (for anaphylaxis).    Historical Provider, MD   BP 151/85  mmHg  Pulse 127  Temp(Src) 98.5 F (36.9 C) (Oral)  Resp 20  SpO2 96% Physical Exam  Constitutional: She is oriented to person, place, and time. She appears well-developed and well-nourished. She appears distressed.  Increased work of breathing, inspiratory stridor at rest. Stridor resolves when she speaks.  HENT:  Head: Normocephalic and atraumatic.  Mouth/Throat: Oropharynx is clear and moist. No oropharyngeal exudate.  Eyes: Conjunctivae and EOM are normal. Pupils are equal, round, and reactive to light.  Neck: Normal range of motion. Neck supple.  No meningismus.  Cardiovascular: Normal rate, regular rhythm, normal heart sounds and intact distal pulses.   No murmur heard. Pulmonary/Chest: Effort normal. No respiratory distress. She has wheezes.  Good air exchange, scattered expiratory wheezing throughout  Abdominal: Soft. There is no tenderness. There is no rebound and no guarding.  Musculoskeletal: Normal range of motion. She exhibits no edema or tenderness.  Neurological: She is alert and oriented to person, place, and time. No cranial nerve deficit. She exhibits normal muscle tone. Coordination normal.  No ataxia on finger to nose bilaterally. No pronator drift. 5/5 strength throughout. CN 2-12 intact.Equal grip strength. Sensation intact.   Skin: Skin is warm.  Psychiatric: She has a normal mood and affect. Her behavior is normal.  Nursing note and vitals reviewed.   ED Course  Procedures (including critical care time) Labs Review Labs Reviewed  CBC WITH DIFFERENTIAL/PLATELET - Abnormal; Notable for the following:    WBC 13.7 (*)    MCV 101.4 (*)    Neutro Abs 10.7 (*)    All other components within normal limits  BASIC METABOLIC PANEL - Abnormal; Notable for the following:    Potassium 3.4 (*)    Glucose, Bld 100 (*)    All other components within normal limits  URINALYSIS, ROUTINE W REFLEX MICROSCOPIC (NOT AT [facility]) - Abnormal; Notable for the following:    Color,  Urine GREEN (*)    Glucose, UA 250 (*)    Hgb urine dipstick SMALL (*)    Ketones, ur 15 (*)    All other components within normal limits  CBC - Abnormal; Notable for the following:    WBC 16.1 (*)    MCV 100.2 (*)    Platelets 410 (*)    All other components within normal limits  URINE MICROSCOPIC-ADD ON - Abnormal; Notable for the following:    Squamous Epithelial / LPF 0-5 (*)    Bacteria, UA FEW (*)    All other components within normal limits  D-DIMER, QUANTITATIVE (NOT AT [facility])  BASIC METABOLIC PANEL  ALPHA-1 ANTITRYPSIN PHENOTYPE  IGE    Imaging Review Dg Neck Soft Tissue  07/31/2015  CLINICAL DATA:  Worsening asthma over the past 4 days. EXAM: NECK SOFT TISSUES - 1+ VIEW COMPARISON:  None. FINDINGS: Normal appearing airway and epiglottis. Mild reversal of the normal cervical lordosis and mild multilevel cervical spine degenerative changes. IMPRESSION: No acute abnormality. Electronically Signed   By: Beckie Salts M.D.   On: 07/31/2015 18:17   Dg Chest Portable 1 View  07/31/2015  CLINICAL DATA:  Shortness of breath. EXAM: PORTABLE CHEST 1 VIEW COMPARISON:  July 04, 2015. FINDINGS: The heart size and mediastinal contours are within normal limits. Both lungs are clear. No pneumothorax or pleural effusion is noted. The visualized skeletal structures are unremarkable. IMPRESSION: No acute cardiopulmonary abnormality seen. Electronically Signed   By: Lupita Raider, M.D.   On: 07/31/2015 15:25   I have personally reviewed and evaluated these images and lab results as part of my medical decision-making.   EKG Interpretation   Date/Time:  Monday July 31 2015 16:56:10 EDT Ventricular Rate:  113 PR Interval:  155 QRS Duration: 98 QT Interval:  334 QTC Calculation: 458 R Axis:   106 Text Interpretation:  Sinus tachycardia Borderline right axis deviation  Baseline wander in lead(s) V2 V5 No significant change was found Confirmed  by Manus Gunning  MD, Hussam Muniz [phone]) on 07/31/2015  5:20:20 PM      MDM   Final diagnoses:  Asthma exacerbation   Asthma exacerbation.  Some inspiratory stridor. Consider possible VCD?  Patient denies history of this.  Nebs, steroids, IVF, IV magnesium CXR clear, neck Xray negative.  Wheezing improved with treatments. Still dyspneic with conversation.  Racemic epi neb given for inspiratory stridor.  continous neb given with some improvement.  Still wheezing, stridor has improved.  Still wheezing and dyspneic.  Plan admission for further treatment. D/w Dr. Katrinka Blazing. D-dimer pending.   CRITICAL CARE Performed by: Glynn Octave Total critical care time: 35 minutes Critical care time was exclusive of separately billable procedures and treating other patients. Critical care was necessary to treat or prevent imminent or life-threatening deterioration. Critical care was time spent personally by me on the following activities: development of treatment plan with patient and/or surrogate as well as nursing, discussions with consultants, evaluation of patient's response to treatment, examination of patient, obtaining history from patient or surrogate, ordering and performing treatments and interventions, ordering and review of laboratory studies, ordering and review of radiographic studies, pulse oximetry and re-evaluation of patient's condition.    Glynn Octave, MD 08/01/15 [phone]

## 2015-08-01 ENCOUNTER — Observation Stay (HOSPITAL_COMMUNITY): Payer: BLUE CROSS/BLUE SHIELD

## 2015-08-01 ENCOUNTER — Encounter (HOSPITAL_COMMUNITY): Payer: Self-pay

## 2015-08-01 DIAGNOSIS — N3010 Interstitial cystitis (chronic) without hematuria: Secondary | ICD-10-CM | POA: Diagnosis present

## 2015-08-01 DIAGNOSIS — J4551 Severe persistent asthma with (acute) exacerbation: Secondary | ICD-10-CM | POA: Diagnosis not present

## 2015-08-01 DIAGNOSIS — E876 Hypokalemia: Secondary | ICD-10-CM | POA: Diagnosis present

## 2015-08-01 DIAGNOSIS — J45901 Unspecified asthma with (acute) exacerbation: Secondary | ICD-10-CM | POA: Diagnosis not present

## 2015-08-01 DIAGNOSIS — R000 Tachycardia, unspecified: Secondary | ICD-10-CM | POA: Diagnosis present

## 2015-08-01 DIAGNOSIS — R6510 Systemic inflammatory response syndrome (SIRS) of non-infectious origin without acute organ dysfunction: Secondary | ICD-10-CM | POA: Diagnosis present

## 2015-08-01 DIAGNOSIS — R061 Stridor: Secondary | ICD-10-CM | POA: Diagnosis not present

## 2015-08-01 DIAGNOSIS — D72829 Elevated white blood cell count, unspecified: Secondary | ICD-10-CM | POA: Diagnosis present

## 2015-08-01 DIAGNOSIS — I10 Essential (primary) hypertension: Secondary | ICD-10-CM | POA: Diagnosis not present

## 2015-08-01 LAB — BASIC METABOLIC PANEL
Anion gap: 14 (ref 5–15)
BUN: 6 mg/dL (ref 6–20)
CALCIUM: 9.2 mg/dL (ref 8.9–10.3)
CO2: 19 mmol/L — ABNORMAL LOW (ref 22–32)
CREATININE: 0.8 mg/dL (ref 0.44–1.00)
Chloride: 104 mmol/L (ref 101–111)
GFR calc Af Amer: >60 mL/min (ref >60)
Glucose, Bld: 156 mg/dL — ABNORMAL HIGH (ref 65–99)
POTASSIUM: 3.4 mmol/L — AB (ref 3.5–5.1)
SODIUM: 137 mmol/L (ref 135–145)

## 2015-08-01 LAB — CBC
HCT: 43.6 % (ref 36.0–46.0)
Hemoglobin: 14.8 g/dL (ref 12.0–15.0)
MCH: 34 pg (ref 26.0–34.0)
MCHC: 33.9 g/dL (ref 30.0–36.0)
MCV: 100.2 fL — ABNORMAL HIGH (ref 78.0–100.0)
PLATELETS: 410 10*3/uL — AB (ref 150–400)
RBC: 4.35 MIL/uL (ref 3.87–5.11)
RDW: 13 % (ref 11.5–15.5)
WBC: 16.1 10*3/uL — AB (ref 4.0–10.5)

## 2015-08-01 LAB — PULMONARY FUNCTION TEST
FEF 25-75 PRE: 3.88 L/s
FEF2575-%Pred-Pre: 118 %
FEV1-%Pred-Pre: 99 %
FEV1-Pre: 3.21 L
FEV1FVC-%PRED-PRE: 104 %
FEV6-%PRED-PRE: 96 %
FEV6-Pre: 3.73 L
FEV6FVC-%PRED-PRE: 102 %
FVC-%Pred-Pre: 94 %
FVC-PRE: 3.73 L
PRE FEV1/FVC RATIO: 86 %
Pre FEV6/FVC Ratio: 100 %

## 2015-08-01 LAB — D-DIMER, QUANTITATIVE: D-Dimer, Quant: <0.27 ug/mL-FEU (ref 0.00–0.50)

## 2015-08-01 MED ORDER — METHYLPREDNISOLONE SODIUM SUCC 125 MG IJ SOLR
60.0000 mg | Freq: Three times a day (TID) | INTRAMUSCULAR | Status: DC
  Administered 2015-08-01 (×2): 60 mg via INTRAVENOUS
  Filled 2015-08-01 (×2): qty 2

## 2015-08-01 MED ORDER — ENOXAPARIN SODIUM 40 MG/0.4ML ~~LOC~~ SOLN
40.0000 mg | SUBCUTANEOUS | Status: DC
  Administered 2015-08-01 – 2015-08-02 (×2): 40 mg via SUBCUTANEOUS
  Filled 2015-08-01 (×2): qty 0.4

## 2015-08-01 MED ORDER — BUDESONIDE 0.5 MG/2ML IN SUSP
0.5000 mg | Freq: Two times a day (BID) | RESPIRATORY_TRACT | Status: DC
  Administered 2015-08-01 – 2015-08-03 (×5): 0.5 mg via RESPIRATORY_TRACT
  Filled 2015-08-01 (×5): qty 2

## 2015-08-01 MED ORDER — POTASSIUM CHLORIDE CRYS ER 20 MEQ PO TBCR
20.0000 meq | EXTENDED_RELEASE_TABLET | Freq: Once | ORAL | Status: AC
  Administered 2015-08-01: 20 meq via ORAL
  Filled 2015-08-01: qty 1

## 2015-08-01 MED ORDER — IPRATROPIUM-ALBUTEROL 0.5-2.5 (3) MG/3ML IN SOLN
3.0000 mL | RESPIRATORY_TRACT | Status: DC
  Administered 2015-08-01 (×2): 3 mL via RESPIRATORY_TRACT
  Filled 2015-08-01 (×2): qty 3

## 2015-08-01 MED ORDER — METHYLPREDNISOLONE SODIUM SUCC 40 MG IJ SOLR
40.0000 mg | Freq: Two times a day (BID) | INTRAMUSCULAR | Status: DC
  Administered 2015-08-01 – 2015-08-02 (×2): 40 mg via INTRAVENOUS
  Filled 2015-08-01 (×2): qty 1

## 2015-08-01 MED ORDER — ALBUTEROL SULFATE (2.5 MG/3ML) 0.083% IN NEBU: 2.5000 mg | INHALATION_SOLUTION | RESPIRATORY_TRACT | Status: DC | PRN

## 2015-08-01 MED ORDER — BUDESONIDE 0.5 MG/2ML IN SUSP: 0.5000 mg | Freq: Two times a day (BID) | RESPIRATORY_TRACT | Status: DC

## 2015-08-01 MED ORDER — POTASSIUM CHLORIDE CRYS ER 20 MEQ PO TBCR
40.0000 meq | EXTENDED_RELEASE_TABLET | Freq: Once | ORAL | Status: AC
  Administered 2015-08-01: 40 meq via ORAL
  Filled 2015-08-01: qty 2

## 2015-08-01 MED ORDER — IPRATROPIUM-ALBUTEROL 0.5-2.5 (3) MG/3ML IN SOLN
3.0000 mL | Freq: Three times a day (TID) | RESPIRATORY_TRACT | Status: DC
  Administered 2015-08-01 – 2015-08-03 (×6): 3 mL via RESPIRATORY_TRACT
  Filled 2015-08-01 (×6): qty 3

## 2015-08-01 NOTE — Care Management Note (Signed)
Case Management Note  Patient Details  Name: Marissa Gibson MRN: [id number] Date of Birth: 1975-11-15  Subjective/Objective:                 Patient in obs for Asthma Exac. She is from home with husband is is independent. Patient states she has a nebulizer and denies medication assistance as she has met deductible and meds are free for the rest of the year. She denies oxygen requirement. Patent states hat she has pulmonologist in [place]. States that she has pretty good control of Asthma and usually get it under control with her pulmonologist and avoid hospitalization, but the last three months have been difficult.   Action/Plan:  Will DC to home when medically stable, no CM needs identified.  Expected Discharge Date:                  Expected Discharge Plan:  Home/Self Care  In-House Referral:     Discharge planning Services  CM Consult  Post Acute Care Choice:  NA Choice offered to:     DME Arranged:    DME Agency:     HH Arranged:    [place] Agency:     Status of Service:  Completed, signed off  Medicare Important Message Given:    Date Medicare IM Given:    Medicare IM give by:    Date Additional Medicare IM Given:    Additional Medicare Important Message give by:     If discussed at [place] of Stay Meetings, dates discussed:    Additional Comments:  Carles Collet, RN 08/01/2015, 12:14 PM

## 2015-08-01 NOTE — ED Notes (Signed)
Spoke to [place] on 5[place] and updated her on patient interventions and status.  Pt transporting to unit at this time

## 2015-08-01 NOTE — Progress Notes (Signed)
NURSING PROGRESS NOTE  Marissa Gibson [id number] Admission Data: 08/01/2015 3:57 AM Attending Provider: Clydie Braunondell A Smith, MD WUJ:WJXBJYN,WGNFAOPCP:EHINGER,ROBERT R, MD Code Status: FULL  Allergies:  Allspice; Other; Xolair; Amitriptyline; Duloxetine; Ibuprofen; Nsaids; Sudafed; Tape; Ceclor; Cymbalta; and Neomycin-bacitracin zn-polymyx Past Medical History:   has a past medical history of Asthma; Endometriosis; Ovarian cyst; and Cystitis. Past Surgical History:   has past surgical history that includes Cholecystectomy; Knee surgery; Appendectomy; Ovarian cyst removal; Ablation on endometriosis; and Hernia repair. Social History:   reports that she has never smoked. She does not have any smokeless tobacco history on file. She reports that she drinks alcohol. She reports that she does not use illicit drugs.  Marissa Gibson is a 40 y.o. female patient admitted from ED:   Last Documented Vital Signs: Blood pressure 120/70, pulse 115, temperature 98 F (36.7 C), temperature source Oral, resp. rate 20, height 5\' 6"  (1.676 m), weight 71.94 kg (158 lb 9.6 oz), SpO2 93 %.  IV Fluids:  IV in place, occlusive dsg intact without redness, IV cath antecubital left, condition patent and no redness none.   Skin: Red, Patient states of steroids, otherwise intact and appropriate  Patient orientated to room. Information packet given to patient. Admission inpatient armband information verified with patient/family to include name and date of birth and placed on patient arm. Side rails up x 2, fall assessment and education completed with patient. Patient able to verbalize understanding of risk associated with falls and verbalized understanding to call for assistance before getting out of bed. Call light within reach. Patient/family able to voice and demonstrate understanding of unit orientation instructions.    Will continue to evaluate and treat per MD orders.  Sue LushKaelin Romesberg RN, BSN

## 2015-08-01 NOTE — Progress Notes (Signed)
Name: Marissa Gibson MRN: [id number] DOB: 03-19-1976    ADMISSION DATE:  07/31/2015 CONSULTATION DATE:  07/31/2015  REFERRING MD :  EDP  CHIEF COMPLAINT:  SOB  BRIEF PATIENT DESCRIPTION: 40 year old female with history of asthma admitted with asthma exacerbation and upper airway wheeze. Refractory to steroids, bronchodilators, PCCM to see.   SIGNIFICANT EVENTS  4/24 admit  STUDIES:  PFT -FEV1 % pred 86% to 104% (+21%)  -FVC % pred 94% to 104% (+10%)  -FEV1/FVC % 75%  -FEF 25-75% pred 68% to 105% (+55%)    HISTORY OF PRESENT ILLNESS:  40 year old female with PMH as below, which includes uncomplicated severe persistent asthma (followed at [facility], Dr. Christell Constant), Interstitial cystitis, and endometriosis. For asthma she has recently started Nucala and has received 5 doses now (4/20 most recent). She did have some viral like syndrome after dose 4, and it is unclear whether they are related. She also receives kenolog injections every 2-3 weeks (4/20), Zyflo, Qvar, and Stiolto. She takes combivent and ventolin PRN. Typically she uses her rescue medications onlu 1-3 times per week, however,  4/22 she developed asthma exacerbation, which was repeatedly refractory to these therapies. Symptoms would minimally improve in between doses, but then worsen again requiring repeated use.  4/25 she presented to ED with complaints SOB, wheeze, and dry hacking cough. She was given steroids and bronchodilators without relief. ED noted wheeze to be located primarily upper airway at level of vocal cords and was concerned for stridor racemic epinephrine was given without much relief. PCCM consulted.    SUBJECTIVE: Feels she is breathing better today, less SOB, no stridor,but still has intermittent chest tightness.   VITAL SIGNS: Temp:  [98 F (36.7 C)-98.5 F (36.9 C)] 98.2 F (36.8 C) (04/25 0525) Pulse Rate:  [99-132] 99 (04/25 0525) Resp:  [19-26] 20 (04/25 0525) BP: (119-162)/(68-94) 119/71 mmHg (04/25  0525) SpO2:  [93 %-100 %] 94 % (04/25 0732) Weight:  [158 lb 9.6 oz (71.94 kg)] 158 lb 9.6 oz (71.94 kg) (04/25 0130)  PHYSICAL EXAMINATION: General:  Female of normal body habitus in no distress Neuro:  Alert, oriented, non-focal HEENT:  [place]/AT, PEERL, no JVD Cardiovascular:  Sinus Tach 125, regular, no MRG  Lungs: Negative wheezing upon auscultated , regular rate and effort Abdomen:  Soft, non-tender, non-distended Musculoskeletal:  No acute deformity or ROM limitation Skin:  Grossly intact   Recent Labs Lab 07/31/15 1527 08/01/15 0136  NA 139 137  K 3.4* 3.4*  CL 104 104  CO2 24 19*  BUN 8 6  CREATININE 0.70 0.80  GLUCOSE 100* 156*    Recent Labs Lab 07/31/15 1527 08/01/15 0136  HGB 14.3 14.8  HCT 43.2 43.6  WBC 13.7* 16.1*  PLT 397 410*   Dg Neck Soft Tissue  07/31/2015  CLINICAL DATA:  Worsening asthma over the past 4 days. EXAM: NECK SOFT TISSUES - 1+ VIEW COMPARISON:  None. FINDINGS: Normal appearing airway and epiglottis. Mild reversal of the normal cervical lordosis and mild multilevel cervical spine degenerative changes. IMPRESSION: No acute abnormality. Electronically Signed   By: Beckie Salts M.D.   On: 07/31/2015 18:17   Ct Chest High Resolution  08/01/2015  CLINICAL DATA:  40 year old female with severe asthma. Shortness of breath. Chest pressure and wheezing yesterday. EXAM: CT CHEST WITHOUT CONTRAST TECHNIQUE: Multidetector CT imaging of the chest was performed following the standard protocol without intravenous contrast. High resolution imaging of the lungs, as well as inspiratory and expiratory imaging,  was performed. COMPARISON:  No priors. FINDINGS: Mediastinum/Lymph Nodes: Heart size is normal. There is no significant pericardial fluid, thickening or pericardial calcification. No pathologically enlarged mediastinal or hilar lymph nodes. Please note that accurate exclusion of hilar adenopathy is limited on noncontrast CT scans. Esophagus is unremarkable in  appearance. No axillary lymphadenopathy. Lungs/Pleura: High-resolution images demonstrate no significant areas of ground-glass attenuation, subpleural reticulation, traction bronchiectasis or frank honeycombing. There are, however, numerous linear opacities and areas of architectural distortion in the lung bases bilaterally, predominantly in the lower lobes of the lungs, favored to reflect a combination of subsegmental atelectasis and chronic post infectious or inflammatory scarring. Inspiratory and expiratory imaging is unremarkable. There is no acute consolidative airspace disease. No pleural effusions. No suspicious appearing pulmonary nodules or masses. Upper abdomen: Status post cholecystectomy. Musculoskeletal: There are no aggressive appearing lytic or blastic lesions noted in the visualized portions of the skeleton. IMPRESSION: 1. There is extensive atelectasis and/or post infectious/inflammatory scarring throughout the lung bases bilaterally, predominantly in the lower lobes of the lungs. 2. No typical stigmata suggestive of interstitial lung disease at this time. Electronically Signed   By: Trudie Reedaniel  Entrikin M.D.   On: 08/01/2015 10:23   Dg Chest Portable 1 View  07/31/2015  CLINICAL DATA:  Shortness of breath. EXAM: PORTABLE CHEST 1 VIEW COMPARISON:  July 04, 2015. FINDINGS: The heart size and mediastinal contours are within normal limits. Both lungs are clear. No pneumothorax or pleural effusion is noted. The visualized skeletal structures are unremarkable. IMPRESSION: No acute cardiopulmonary abnormality seen. Electronically Signed   By: Lupita RaiderJames  Green Jr, M.D.   On: 07/31/2015 15:25    ASSESSMENT / PLAN:  Severe persistent asthma with acute exacerbation Consider component vocal cord dysfunction or mechanical laryngeal abnormality - Supplemental O2 as needed to keep SpO2 > 92% - Continue Scheduled duoneb/pulmicort - Continue PRN albuterol - Systemic steroids ( decrease to 40 q 12 , no audible  wheezing today) - Defer further nucala to primary pulmonologist - Recommend anxiolytic, but patient feels as though she has had bad responses to these in past and would prefer not to take - CT neck soft tissues done 08/01/2015, follow results - Consider  ENT involvement once CT results available - Will continue to follow  Bevelyn NgoSarah F. Kiyo Heal, AGACNP-BC [facility] Pulmonary/Critical Care Medicine Pager # [phone]  08/01/2015 10:25 AM

## 2015-08-01 NOTE — Progress Notes (Addendum)
Progress Note    Marissa Gibson  BJY:[id number] DOB: [birth year]  DOA: 07/31/2015 PCP: Thora Lance, MD   Outpatient Specialists:   Dr. Christell Constant, Pulmonologist at [facility]   Brief Narrative:   Marissa Gibson is an 40 y.o. female with a PMH of uncomplicated severe persistent asthma, interstitial cystitis, and endometriosis, recently started on Nucala by her pulmonologist with last dose given 07/27/15 who developed a viral-like syndrome after her fourth dose out of 5. Her asthma is also managed with kenolog injections every 2-3 weeks (last dose 07/27/15), Zyflo, Qvar, and Stiolto as well as Combivent and Ventolin as needed. She was admitted 07/31/15 with persistent shortness of breath, wheezing and a dry hacking cough. Treated with steroids and bronchodilators in the ED without significant improvement and therefore admitted to the hospital. Pulmonology was consulted for evaluation of these symptoms as well as stridor.  Assessment/Plan:   Principal Problem:   Asthma exacerbation with stridor Admission evaluation included a CT of the neck which was clear and a chest x-ray which was clear. Pulmonology consulted for evaluation of symptoms including stridor. Spirometry ordered with recommendations to pursue ENT consult to evaluate for vocal cord dysfunction versus anatomic pathology. Will need outpatient FeNO testing as an outpatient.  F/U IgE and alpha-1 antitrypsin testing. Continue systemic steroids, Pulmicort and bronchodilators. High-resolution CT of the chest showed extensive atelectasis and/or post infectious/inflammatory scarring throughout the lung bases bilaterally, but no evidence of interstitial lung disease.  Await pulmonology recommendations.  Since there is no stridor on my exam today, will hold off on ENT evaluation inpatient, as it is unlikely she has an anatomic defect.  Outpatient work up can be pursued if still recommended by pulmonology.  Active Problems:    Leukocytosis/SIRS Urinalysis and chest x-ray clear. Suspect leukocytosis secondary to steroids and elevated heart rate, respiratory rate secondary to bronchospasm. No evidence of sepsis/infectious etiology.    Sinus tachycardia (HCC) Likely secondary to bronchodilator treatment.    Hypokalemia Supplemented. Potassium 3.4 this morning. We'll give 40 mEq of oral potassium today.    Interstitial cystitis Continue pentosan and Urogesic-blue.    Essential Hypertension Continue Norvasc.   Family Communication/Anticipated D/C date and plan/Code Status   DVT prophylaxis: Lovenox ordered. Code Status: Full Code.  Family Communication: No family currently at the bedside. Disposition Plan: Home in 24 hours if OK with pulmonologist.   Medical Consultants:    Pulmonology   Procedures:    Anti-Infectives:   Anti-infectives    None      Subjective:   Marissa Gibson says she is breathing much better.  No cough.  Throat a bit sore.  No other pain.  Objective:    Filed Vitals:   08/01/15 0208 08/01/15 0525 08/01/15 0730 08/01/15 0732  BP: 120/70 119/71    Pulse: 115 99    Temp: 98 F (36.7 C) 98.2 F (36.8 C)    TempSrc: Oral Oral    Resp: 20 20    Height:      Weight:      SpO2: 93% 95% 94% 94%    Intake/Output Summary (Last 24 hours) at 08/01/15 0740 Last data filed at 08/01/15 0865  Gross per 24 hour  Intake    400 ml  Output    125 ml  Net    275 ml   Filed Weights   08/01/15 0130  Weight: 71.94 kg (158 lb 9.6 oz)    Exam: General exam: Appears calm and comfortable.  Respiratory system: Clear  to auscultation. No wheeze or stridor apparent.  Respiratory effort normal. Cardiovascular system: S1 & S2 heard, mildly tachycardic, regular. No JVD, murmurs, rubs, gallops or clicks. No pedal edema. Gastrointestinal system: Abdomen is nondistended, soft and nontender. No organomegaly or masses felt. Normal bowel sounds heard. Central nervous system: Alert and  oriented. No focal neurological deficits. Extremities: Symmetric 5 x 5 power. No clubbing, edema, or cyanosis. Skin: No rashes, lesions or ulcers Psychiatry: Judgement and insight appear normal. Mood & affect appropriate.   Data Reviewed:   I have personally reviewed following labs and imaging studies:  Labs: Basic Metabolic Panel:  Recent Labs Lab 07/31/15 1527 08/01/15 0136  NA 139 137  K 3.4* 3.4*  CL 104 104  CO2 24 19*  GLUCOSE 100* 156*  BUN 8 6  CREATININE 0.70 0.80  CALCIUM 9.3 9.2   GFR Estimated Creatinine Clearance: 95.8 mL/min (by C-G formula based on Cr of 0.8). Liver Function Tests: No results for input(s): AST, ALT, ALKPHOS, BILITOT, PROT, ALBUMIN in the last 168 hours. No results for input(s): LIPASE, AMYLASE in the last 168 hours. No results for input(s): AMMONIA in the last 168 hours. Coagulation profile No results for input(s): INR, PROTIME in the last 168 hours.  CBC:  Recent Labs Lab 07/31/15 1527 08/01/15 0136  WBC 13.7* 16.1*  NEUTROABS 10.7*  --   HGB 14.3 14.8  HCT 43.2 43.6  MCV 101.4* 100.2*  PLT 397 410*   D-Dimer:  Recent Labs  08/01/15 0136  DDIMER <0.27   Urine analysis:    Component Value Date/Time   COLORURINE GREEN* 07/31/2015 2247   APPEARANCEUR CLEAR 07/31/2015 2247   LABSPEC 1.013 07/31/2015 2247   PHURINE 5.5 07/31/2015 2247   GLUCOSEU 250* 07/31/2015 2247   HGBUR SMALL* 07/31/2015 2247   BILIRUBINUR NEGATIVE 07/31/2015 2247   KETONESUR 15* 07/31/2015 2247   PROTEINUR NEGATIVE 07/31/2015 2247   UROBILINOGEN 0.2 11/08/2014 0551   NITRITE NEGATIVE 07/31/2015 2247   LEUKOCYTESUR NEGATIVE 07/31/2015 2247   Microbiology No results found for this or any previous visit (from the past 240 hour(s)).  Radiology: Dg Neck Soft Tissue  07/31/2015  CLINICAL DATA:  Worsening asthma over the past 4 days. EXAM: NECK SOFT TISSUES - 1+ VIEW COMPARISON:  None. FINDINGS: Normal appearing airway and epiglottis. Mild reversal  of the normal cervical lordosis and mild multilevel cervical spine degenerative changes. IMPRESSION: No acute abnormality. Electronically Signed   By: Beckie SaltsSteven  Reid M.D.   On: 07/31/2015 18:17   Dg Chest Portable 1 View  07/31/2015  CLINICAL DATA:  Shortness of breath. EXAM: PORTABLE CHEST 1 VIEW COMPARISON:  July 04, 2015. FINDINGS: The heart size and mediastinal contours are within normal limits. Both lungs are clear. No pneumothorax or pleural effusion is noted. The visualized skeletal structures are unremarkable. IMPRESSION: No acute cardiopulmonary abnormality seen. Electronically Signed   By: Lupita RaiderJames  Green Jr, M.D.   On: 07/31/2015 15:25    Medications:   . acidophilus  1 capsule Oral Daily  . amLODipine  5 mg Oral Daily  . budesonide (PULMICORT) nebulizer solution  0.5 mg Nebulization BID  . calcium-vitamin D  1 tablet Oral Q breakfast  . cholestyramine  4 g Oral BID  . clotrimazole  1 Applicatorful Vaginal QHS  . cycloSPORINE  2 drop Both Eyes BID  . enoxaparin (LOVENOX) injection  40 mg Subcutaneous Q24H  . fluticasone  1 spray Each Nare Daily  . ipratropium  1 spray Each Nare BID  .  ipratropium-albuterol  3 mL Nebulization TID  . loratadine  10 mg Oral q morning - 10a  . losartan  50 mg Oral Daily  . magnesium oxide  400 mg Oral Daily  . methylPREDNISolone (SOLU-MEDROL) injection  60 mg Intravenous Q8H  . multivitamin with minerals  1 tablet Oral Daily  . pentosan polysulfate  100 mg Oral TID  . UROGESIC-BLUE  1 tablet Oral See admin instructions   Continuous Infusions:   Time spent: 25 minutes.     RAMA,CHRISTINA  [facility] Pager [phone]. If unable to reach me by pager, please call my cell phone at [phone].  *Please refer to [email or web address], password [email or web address] to get updated schedule on who will round on this patient, as hospitalists switch teams weekly. If 7PM-7AM, please contact night-coverage at [email or web address], password [email or web address] for any overnight needs.  08/01/2015,  7:40 AM

## 2015-08-01 NOTE — Consult Note (Signed)
Name: Marissa Gibson MRN: [id number] DOB: [birth year]    ADMISSION DATE:  07/31/2015 CONSULTATION DATE:  07/31/2015  REFERRING MD :  EDP  CHIEF COMPLAINT:  SOB  BRIEF PATIENT DESCRIPTION: 40 year old female with history of asthma admitted with asthma exacerbation and upper airway wheeze. Refractory to steroids, bronchodilators, PCCM to see.   SIGNIFICANT EVENTS  4/24 admit  STUDIES:  PFT -FEV1 % pred 86% to 104% (+21%)  -FVC % pred 94% to 104% (+10%)  -FEV1/FVC % 75%  -FEF 25-75% pred 68% to 105% (+55%)    HISTORY OF PRESENT ILLNESS:  40 year old female with PMH as below, which includes uncomplicated severe persistent asthma (followed at [facility], Dr. Christell Constant), Interstitial cystitis, and endometriosis. For asthma she has recently started Nucala and has received 5 doses now (4/20 most recent). She did have some viral like syndrome after dose 4, and it is unclear whether they are related. She also receives kenolog injections every 2-3 weeks (4/20), Zyflo, Qvar, and Stiolto. She takes combivent and ventolin PRN. Typically she uses her rescue medications onlu 1-3 times per week, however,  4/22 she developed asthma exacerbation, which was repeatedly refractory to these therapies. Symptoms would minimally improve in between doses, but then worsen again requiring repeated use.  4/25 she presented to ED with complaints SOB, wheeze, and dry hacking cough. She was given steroids and bronchodilators without relief. ED noted wheeze to be located primarily upper airway at level of vocal cords and was concerned for stridor racemic epinephrine was given without much relief. PCCM consulted.   PAST MEDICAL HISTORY :   has a past medical history of Asthma; Endometriosis; Ovarian cyst; and Cystitis.  has past surgical history that includes Cholecystectomy; Knee surgery; Appendectomy; Ovarian cyst removal; Ablation on endometriosis; and Hernia repair. Prior to Admission medications   Medication Sig Start Date  End Date Taking? Authorizing Provider  acetaminophen (TYLENOL) 325 MG tablet Take 650 mg by mouth every 6 (six) hours as needed for mild pain.   Yes Historical Provider, MD  albuterol (PROVENTIL HFA;VENTOLIN HFA) 108 (90 BASE) MCG/ACT inhaler Inhale 1 puff into the lungs every 6 (six) hours as needed for wheezing or shortness of breath.   Yes Historical Provider, MD  albuterol (PROVENTIL) (2.5 MG/3ML) 0.083% nebulizer solution Take 2.5 mg by nebulization every 8 (eight) hours as needed for wheezing or shortness of breath.   Yes Historical Provider, MD  amLODipine (NORVASC) 5 MG tablet Take 5 mg by mouth daily.   Yes Historical Provider, MD  B Complex-C (SUPER B COMPLEX/VITAMIN C PO) Take 1 tablet by mouth daily.   Yes Historical Provider, MD  beclomethasone (QVAR) 80 MCG/ACT inhaler Inhale 2 puffs into the lungs 3 (three) times daily.   Yes Historical Provider, MD  Beclomethasone Dipropionate (QNASL) 80 MCG/ACT AERS Place 1 spray into the nose 2 (two) times daily.    Yes Historical Provider, MD  bupivacaine (MARCAINE) 0.5 % SOLN injection 10 mLs by Infiltration route See admin instructions. 2-3 times per day combined with Sodium Bicarbonate for bladder instillation   Yes Historical Provider, MD  calcium-vitamin D (OSCAL WITH D) 500-200 MG-UNIT per tablet Take 1 tablet by mouth daily with breakfast.    Yes Historical Provider, MD  cholestyramine (QUESTRAN) 4 g packet Take 4 g by mouth 2 (two) times daily.   Yes Historical Provider, MD  CRANBERRY EXTRACT PO Take 1 tablet by mouth daily.    Yes Historical Provider, MD  cycloSPORINE (RESTASIS) 0.05 % ophthalmic  emulsion Place 2 drops into both eyes 2 (two) times daily.   Yes Historical Provider, MD  estradiol (CLIMARA - DOSED IN MG/24 HR) 0.0375 mg/24hr patch Place 0.0375 mg onto the skin every Sunday.    Yes Historical Provider, MD  FIBER PO Take 2 tablets by mouth daily.   Yes Historical Provider, MD  glucosamine-chondroitin (COSAMIN DS) 500-400 MG  tablet Take 1 tablet by mouth daily.   Yes Historical Provider, MD  guaiFENesin (MUCINEX) 600 MG 12 hr tablet Take 600 mg by mouth 2 (two) times daily as needed for cough.   Yes Historical Provider, MD  heparin [id number] UNIT/ML injection Inject 40,000 Units into the skin See admin instructions. 2-3 times per day combined with bupivacaine for bladder instillation   Yes Historical Provider, MD  HYDROcodone-acetaminophen (NORCO/VICODIN) 5-325 MG tablet Take 1 tablet by mouth every 6 (six) hours as needed for moderate pain.   Yes Historical Provider, MD  ipratropium (ATROVENT) 0.06 % nasal spray Place 1 spray into both nostrils 2 (two) times daily.    Yes Historical Provider, MD  Ipratropium-Albuterol (COMBIVENT RESPIMAT) 20-100 MCG/ACT AERS respimat Inhale 1 puff into the lungs every 6 (six) hours as needed for wheezing or shortness of breath.   Yes Historical Provider, MD  levocetirizine (XYZAL) 5 MG tablet Take 5 mg by mouth every morning.    Yes Historical Provider, MD  levonorgestrel (MIRENA) 20 MCG/24HR IUD 1 each by Intrauterine route once.   Yes Historical Provider, MD  losartan (COZAAR) 50 MG tablet Take 50 mg by mouth daily.   Yes Historical Provider, MD  magnesium oxide (MAG-OX) 400 MG tablet Take 400 mg by mouth daily.   Yes Historical Provider, MD  Mepolizumab (NUCALA) 100 MG SOLR Inject 100 mg into the skin every 30 (thirty) days.   Yes Historical Provider, MD  metaxalone (SKELAXIN) 800 MG tablet Take 800 mg by mouth as needed for muscle spasms.   Yes Historical Provider, MD  Methen-Hyosc-Meth Blue-Na Phos (UROGESIC-BLUE) 81.6 MG TABS Take 1 tablet by mouth See admin instructions. Takes 2-3 times daily   Yes Historical Provider, MD  Multiple Vitamin (MULTIVITAMIN WITH MINERALS) TABS tablet Take 1 tablet by mouth daily.   Yes Historical Provider, MD  pentosan polysulfate (ELMIRON) 100 MG capsule Take 100 mg by mouth 3 (three) times daily.   Yes Historical Provider, MD  Probiotic Product  (PROBIOTIC FORMULA) CAPS Take 1 capsule by mouth daily.   Yes Historical Provider, MD  SODIUM BICARBONATE IV Inject 2-3 mLs into the vein See admin instructions. 2-3 times per day combined with bupivacaine for bladder instillation   Yes Historical Provider, MD  terconazole (TERAZOL 7) 0.4 % vaginal cream Place 1 applicator vaginally at bedtime. Started 4/23 for 7 days ending 4/30   Yes Historical Provider, MD  Tiotropium Bromide-Olodaterol (STIOLTO RESPIMAT) 2.5-2.5 MCG/ACT AERS Inhale 1 puff into the lungs 2 (two) times daily.   Yes Historical Provider, MD  Triamcinolone Acetonide (KENALOG IJ) Inject 80 mg as directed every 21 ( twenty-one) days.   Yes Historical Provider, MD  zileuton (ZYFLO CR) 600 MG CR tablet Take 1,200 mg by mouth 2 (two) times daily.   Yes Historical Provider, MD  EPINEPHrine (EPIPEN 2-PAK) 0.3 mg/0.3 mL IJ SOAJ injection Inject 0.3 mg into the muscle as needed (for anaphylaxis).    Historical Provider, MD   Allergies  Allergen Reactions  . 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 Other Nausea And  Vomiting    anesthesia-severe vomiting with and after sedation Cats, dogs, ragweed, mold, cockroaches, dust, grass pollens, perfumes and polyester-unspecified allergens  . Xolair [Omalizumab] Hives  . Amitriptyline Other (See Comments)    Other reaction(s): GI Upset (intolerance)  . Duloxetine Nausea And Vomiting and Other (See Comments)    Headaches, nausea & vomiting, vertigo  . Ibuprofen Other (See Comments)    Limit ibuprofen type of meds-per MD  . Nsaids Other (See Comments)    Limit use of NSAIDS-per MD  . Lyman Bishop [Pseudoephedrine Hcl] Other (See Comments)    lethargic  . Tape Rash    Steri-strips, bandaids, etc  . Ceclor [Cefaclor] Rash  . Cymbalta [Duloxetine Hcl] Other (See Comments)  . Neomycin-Bacitracin Zn-Polymyx Rash    FAMILY HISTORY:  family history is not on file. SOCIAL HISTORY:   reports that she has never smoked. She does not have any smokeless tobacco history on file. She reports that she drinks alcohol. She reports that she does not use illicit drugs.  REVIEW OF SYSTEMS:   Constitutional: Negative for fever, chills, weight loss, malaise/fatigue and diaphoresis.  HENT: Negative for hearing loss, ear pain, nosebleeds, congestion, sore throat, neck pain, tinnitus and ear discharge.   Eyes: Negative for blurred vision, double vision, photophobia, pain, discharge and redness.  Respiratory: Negative for cough, hemoptysis, sputum production, shortness of breath, wheezing and stridor.   Cardiovascular: Negative for chest pain, palpitations, orthopnea, claudication, leg swelling and PND.  Gastrointestinal: Negative for heartburn, nausea, vomiting, abdominal pain, diarrhea, constipation, blood in stool and melena.  Genitourinary: Negative for dysuria, urgency, frequency, hematuria and flank pain.  Musculoskeletal: Negative for myalgias, back pain, joint pain and falls.  Skin: Negative for itching and rash.  Neurological: Negative for dizziness, tingling, tremors, sensory change, speech change, focal weakness, seizures, loss of consciousness, weakness and headaches.  Endo/Heme/Allergies: Negative for environmental allergies and polydipsia. Does not bruise/bleed easily.  SUBJECTIVE:   VITAL SIGNS: Temp:  [98.5 F (36.9 C)] 98.5 F (36.9 C) (04/24 1502) Pulse Rate:  [106-132] 110 (04/25 0015) Resp:  [19-26] 24 (04/25 0015) BP: (130-162)/(68-94) 132/93 mmHg (04/24 2130) SpO2:  [95 %-100 %] 100 % (04/25 0015)  PHYSICAL EXAMINATION: General:  Female of normal body habitus in minimal distress Neuro:  Alert, oriented, non-focal HEENT:  [place]/AT, PEERL, no JVD Cardiovascular:  Tachy, regular, no MRG (currently on racemic epi neb) Lungs:  Transmitted upper airway wheeze auscultated on inspiration only.  Abdomen:  Soft, non-tender, non-distended Musculoskeletal:  No acute  deformity or ROM limitation Skin:  Grossly intact   Recent Labs Lab 07/31/15 1527  NA 139  K 3.4*  CL 104  CO2 24  BUN 8  CREATININE 0.70  GLUCOSE 100*    Recent Labs Lab 07/31/15 1527  HGB 14.3  HCT 43.2  WBC 13.7*  PLT 397   Dg Neck Soft Tissue  07/31/2015  CLINICAL DATA:  Worsening asthma over the past 4 days. EXAM: NECK SOFT TISSUES - 1+ VIEW COMPARISON:  None. FINDINGS: Normal appearing airway and epiglottis. Mild reversal of the normal cervical lordosis and mild multilevel cervical spine degenerative changes. IMPRESSION: No acute abnormality. Electronically Signed   By: Beckie Salts M.D.   On: 07/31/2015 18:17   Dg Chest Portable 1 View  07/31/2015  CLINICAL DATA:  Shortness of breath. EXAM: PORTABLE CHEST 1 VIEW COMPARISON:  July 04, 2015. FINDINGS: The heart size and mediastinal contours are within normal limits. Both lungs are clear. No pneumothorax or pleural effusion is  noted. The visualized skeletal structures are unremarkable. IMPRESSION: No acute cardiopulmonary abnormality seen. Electronically Signed   By: Lupita Raider, M.D.   On: 07/31/2015 15:25    ASSESSMENT / PLAN:  Severe persistent asthma with acute exacerbation Consider component vocal cord dysfunction or mechanical laryngeal abnormality - Supplemental O2 as needed to keep SpO2 > 92% - Scheduled duoneb/pulmicort - PRN albuterol - Systemic steroids (Solumedrol  q 8 hours) - Defer further nucala to primary pulmonologist - Recommend anxiolytic, but patient feels as though she has had bad responses to these in past and would prefer not to take - CT neck soft tissues - Recommend ENT involvement  - Will continue to follow  Joneen Roach, AGACNP-BC [facility] Pager [phone] or [phone]  08/01/2015 1:19 AM

## 2015-08-02 DIAGNOSIS — J45901 Unspecified asthma with (acute) exacerbation: Secondary | ICD-10-CM | POA: Diagnosis not present

## 2015-08-02 LAB — BASIC METABOLIC PANEL
ANION GAP: 9 (ref 5–15)
BUN: 11 mg/dL (ref 6–20)
CALCIUM: 9.4 mg/dL (ref 8.9–10.3)
CO2: 25 mmol/L (ref 22–32)
Chloride: 104 mmol/L (ref 101–111)
Creatinine, Ser: 0.63 mg/dL (ref 0.44–1.00)
GFR calc Af Amer: >60 mL/min (ref >60)
GLUCOSE: 110 mg/dL — AB (ref 65–99)
POTASSIUM: 4.6 mmol/L (ref 3.5–5.1)
Sodium: 138 mmol/L (ref 135–145)

## 2015-08-02 LAB — CBC
HCT: 42.5 % (ref 36.0–46.0)
Hemoglobin: 14.1 g/dL (ref 12.0–15.0)
MCH: 33.6 pg (ref 26.0–34.0)
MCHC: 33.2 g/dL (ref 30.0–36.0)
MCV: 101.2 fL — ABNORMAL HIGH (ref 78.0–100.0)
Platelets: 415 10*3/uL — ABNORMAL HIGH (ref 150–400)
RBC: 4.2 MIL/uL (ref 3.87–5.11)
RDW: 13.1 % (ref 11.5–15.5)
WBC: 17.7 10*3/uL — ABNORMAL HIGH (ref 4.0–10.5)

## 2015-08-02 LAB — ALPHA-1 ANTITRYPSIN PHENOTYPE: A-1 Antitrypsin, Ser: 114 mg/dL (ref 90–200)

## 2015-08-02 LAB — MAGNESIUM: Magnesium: 2.1 mg/dL (ref 1.7–2.4)

## 2015-08-02 LAB — IGE: IgE (Immunoglobulin E), Serum: 18 IU/mL (ref 0–100)

## 2015-08-02 MED ORDER — METHYLPREDNISOLONE SODIUM SUCC 40 MG IJ SOLR
40.0000 mg | INTRAMUSCULAR | Status: DC
  Administered 2015-08-03: 40 mg via INTRAVENOUS
  Filled 2015-08-02: qty 1

## 2015-08-02 MED ORDER — ZILEUTON ER 600 MG PO TB12
1200.0000 mg | ORAL_TABLET | Freq: Two times a day (BID) | ORAL | Status: DC
  Administered 2015-08-02 – 2015-08-03 (×2): 1200 mg via ORAL
  Filled 2015-08-02 (×3): qty 2

## 2015-08-02 MED ORDER — UROGESIC-BLUE 81.6 MG PO TABS
1.0000 | ORAL_TABLET | Freq: Two times a day (BID) | ORAL | Status: DC
  Administered 2015-08-02 – 2015-08-03 (×3): 81.6 mg via ORAL
  Filled 2015-08-02 (×3): qty 1

## 2015-08-02 NOTE — Progress Notes (Signed)
Name: Marissa Gibson MRN: [id number] DOB: Jun 18, 1975    ADMISSION DATE:  07/31/2015 CONSULTATION DATE:  07/31/2015  REFERRING MD :  EDP  CHIEF COMPLAINT:  SOB    HISTORY OF PRESENT ILLNESS:  40 year old female with PMH as below, which includes uncomplicated severe persistent asthma (followed at [facility], Dr. Christell Constant), Interstitial cystitis, and endometriosis. For asthma she has recently started Nucala and has received 5 doses now (4/20 most recent). She did have some viral like syndrome after dose 4, and it is unclear whether they are related. She also receives kenolog injections every 2-3 weeks (4/20), Zyflo, Qvar, and Stiolto. She takes combivent and ventolin PRN. Typically she uses her rescue medications onlu 1-3 times per week, however,  4/22 she developed asthma exacerbation, which was repeatedly refractory to these therapies. Symptoms would minimally improve in between doses, but then worsen again requiring repeated use.  4/25 she presented to ED with complaints SOB, wheeze, and dry hacking cough. She was given steroids and bronchodilators without relief. ED noted wheeze to be located primarily upper airway at level of vocal cords and was concerned for stridor racemic epinephrine was given without much relief. PCCM consulted.    SUBJECTIVE:  Continues to have intermittent chest tightness.  Unchanged from yesterday Afebrile  VITAL SIGNS: Temp:  [98.1 F (36.7 C)-98.7 F (37.1 C)] 98.1 F (36.7 C) (04/26 0527) Pulse Rate:  [85-112] 85 (04/26 0527) Resp:  [18-20] 18 (04/26 0527) BP: (117-129)/(73-88) 129/88 mmHg (04/26 0527) SpO2:  [95 %-97 %] 96 % (04/26 0831)  PHYSICAL EXAMINATION: General:  Female of normal body habitus in no distress Neuro:  Alert, oriented, non-focal HEENT:  [place]/AT, PEERL, no JVD Cardiovascular:   regular, no MRG  Lungs: Negative wheezing upon auscultated , regular rate and effort, rt basal rales Abdomen:  Soft, non-tender, non-distended Musculoskeletal:  No  acute deformity or ROM limitation Skin:  Grossly intact   Recent Labs Lab 07/31/15 1527 08/01/15 0136 08/02/15 0610  NA 139 137 138  K 3.4* 3.4* 4.6  CL 104 104 104  CO2 24 19* 25  BUN CREATININE 0.70 0.80 0.63  GLUCOSE 100* 156* 110*    Recent Labs Lab 07/31/15 1527 08/01/15 0136 08/02/15 0610  HGB 14.3 14.8 14.1  HCT 43.2 43.6 42.5  WBC 13.7* 16.1* 17.7*  PLT 397 410* 415*   Dg Neck Soft Tissue  07/31/2015  CLINICAL DATA:  Worsening asthma over the past 4 days. EXAM: NECK SOFT TISSUES - 1+ VIEW COMPARISON:  None. FINDINGS: Normal appearing airway and epiglottis. Mild reversal of the normal cervical lordosis and mild multilevel cervical spine degenerative changes. IMPRESSION: No acute abnormality. Electronically Signed   By: Beckie Salts M.D.   On: 07/31/2015 18:17   Ct Chest High Resolution  08/01/2015  CLINICAL DATA:  41 year old female with severe asthma. Shortness of breath. Chest pressure and wheezing yesterday. EXAM: CT CHEST WITHOUT CONTRAST TECHNIQUE: Multidetector CT imaging of the chest was performed following the standard protocol without intravenous contrast. High resolution imaging of the lungs, as well as inspiratory and expiratory imaging, was performed. COMPARISON:  No priors. FINDINGS: Mediastinum/Lymph Nodes: Heart size is normal. There is no significant pericardial fluid, thickening or pericardial calcification. No pathologically enlarged mediastinal or hilar lymph nodes. Please note that accurate exclusion of hilar adenopathy is limited on noncontrast CT scans. Esophagus is unremarkable in appearance. No axillary lymphadenopathy. Lungs/Pleura: High-resolution images demonstrate no significant areas of ground-glass attenuation, subpleural reticulation, traction bronchiectasis or frank  honeycombing. There are, however, numerous linear opacities and areas of architectural distortion in the lung bases bilaterally, predominantly in the lower lobes of the lungs,  favored to reflect a combination of subsegmental atelectasis and chronic post infectious or inflammatory scarring. Inspiratory and expiratory imaging is unremarkable. There is no acute consolidative airspace disease. No pleural effusions. No suspicious appearing pulmonary nodules or masses. Upper abdomen: Status post cholecystectomy. Musculoskeletal: There are no aggressive appearing lytic or blastic lesions noted in the visualized portions of the skeleton. IMPRESSION: 1. There is extensive atelectasis and/or post infectious/inflammatory scarring throughout the lung bases bilaterally, predominantly in the lower lobes of the lungs. 2. No typical stigmata suggestive of interstitial lung disease at this time. Electronically Signed   By: Trudie Reedaniel  Entrikin M.D.   On: 08/01/2015 10:23   Dg Chest Portable 1 View  07/31/2015  CLINICAL DATA:  Shortness of breath. EXAM: PORTABLE CHEST 1 VIEW COMPARISON:  July 04, 2015. FINDINGS: The heart size and mediastinal contours are within normal limits. Both lungs are clear. No pneumothorax or pleural effusion is noted. The visualized skeletal structures are unremarkable. IMPRESSION: No acute cardiopulmonary abnormality seen. Electronically Signed   By: Lupita RaiderJames  Green Jr, M.D.   On: 07/31/2015 15:25   Significant tests/ events  HRCT chest shows minimal bibasal scarring probably postinfectious  Spirometry-shows no evidence of airway obstruction, no flattening of the flow volume loop  ASSESSMENT / PLAN:  Severe persistent asthma with acute exacerbation Consider component vocal cord dysfunction or mechanical laryngeal abnormality - Supplemental O2 as needed to keep SpO2 > 92% - Continue Scheduled duoneb/pulmicort - Continue PRN albuterol - Systemic steroids ( decrease to 40 q 24 , no audible wheezing today) - Defer further nucala to primary pulmonologist - will have to be dc'd given her reaction - Consider  ENT Evaluation as outpatient   I will touch base with her  primary pulmonologist [facility]   Rakesh Alva MD. FCCP. [facility] Pager [phone] If no response call [phone]   08/02/2015     08/02/2015 11:51 AM

## 2015-08-02 NOTE — Progress Notes (Signed)
Progress Note    Marissa Gibson  AVW:[id number] DOB: [birth year]  DOA: 07/31/2015 PCP: Thora Lance, MD   Outpatient Specialists:   Dr. Christell Constant, Pulmonologist at [facility]   Brief Narrative:   Marissa Gibson is an 40 y.o. female with a PMH of uncomplicated severe persistent asthma, interstitial cystitis, and endometriosis, recently started on Nucala by her pulmonologist with last dose given 07/27/15 who developed a viral-like syndrome after her fourth dose out of 5. Her asthma is also managed with kenolog injections every 2-3 weeks (last dose 07/27/15), Zyflo, Qvar, and Stiolto as well as Combivent and Ventolin as needed. She was admitted 07/31/15 with persistent shortness of breath, wheezing and a dry hacking cough. Treated with steroids and bronchodilators in the ED without significant improvement and therefore admitted to the hospital. Pulmonology was consulted for evaluation of these symptoms as well as stridor.  Assessment/Plan:   Principal Problem:   Asthma exacerbation with stridor:  Admission evaluation included a CT of the neck which was clear and a chest x-ray which was clear. Pulmonology consulted for evaluation of symptoms including stridor. Spirometry ordered with recommendations to pursue ENT consult to evaluate for vocal cord dysfunction versus anatomic pathology. Taper  systemic steroids, Pulmicort and bronchodilators. High-resolution CT of the chest showed extensive atelectasis and/or post infectious/inflammatory scarring throughout the lung bases bilaterally, but no evidence of interstitial lung disease.  Await pulmonology recommendations.  Since there is no stridor on my exam today, will hold off on ENT evaluation inpatient, as it is unlikely she has an anatomic defect.  Outpatient work up can be pursued if still recommended by pulmonology.  Active Problems:   Leukocytosis/SIRS Urinalysis and chest x-ray clear. Suspect leukocytosis secondary to steroids and elevated heart rate,  respiratory rate secondary to bronchospasm. No evidence of sepsis/infectious etiology.    Sinus tachycardia (HCC) Likely secondary to bronchodilator treatment.    Hypokalemia Supplemented. Potassium 3.4 this morning. We'll give 40 mEq of oral potassium today.    Interstitial cystitis Continue pentosan and Urogesic-blue.    Essential Hypertension Continue Norvasc.   Family Communication/Anticipated D/C date and plan/Code Status   DVT prophylaxis: Lovenox ordered. Code Status: Full Code.  Family Communication: No family currently at the bedside. Disposition Plan: Home in 24 hours if OK with pulmonologist.   Medical Consultants:    Pulmonology   Procedures:    Anti-Infectives:   Anti-infectives    None      Subjective:   Marissa Gibson says she is breathing much better.  No cough.   Objective:    Filed Vitals:   08/02/15 0527 08/02/15 0831 08/02/15 1426 08/02/15 1442  BP: 129/88   121/71  Pulse: 85   103  Temp: 98.1 F (36.7 C)   97.7 F (36.5 C)  TempSrc: Oral   Oral  Resp: 18   20  Height:      Weight:      SpO2: 95% 96% 98% 97%    Intake/Output Summary (Last 24 hours) at 08/02/15 1722 Last data filed at 08/02/15 0930  Gross per 24 hour  Intake   1600 ml  Output    750 ml  Net    850 ml   Filed Weights   08/01/15 0130  Weight: 71.94 kg (158 lb 9.6 oz)    Exam: General exam: Appears calm and comfortable.  Respiratory system: Clear to auscultation. No wheeze or stridor apparent.  Respiratory effort normal. Cardiovascular system: S1 & S2 heard, mildly tachycardic, regular. No JVD,  murmurs, rubs, gallops or clicks. No pedal edema. Gastrointestinal system: Abdomen is nondistended, soft and nontender. No organomegaly or masses felt. Normal bowel sounds heard. Central nervous system: Alert and oriented. No focal neurological deficits. Extremities: Symmetric 5 x 5 power. No clubbing, edema, or cyanosis. Skin: No rashes, lesions or  ulcers Psychiatry: Judgement and insight appear normal. Mood & affect appropriate.   Data Reviewed:   I have personally reviewed following labs and imaging studies:  Labs: Basic Metabolic Panel:  Recent Labs Lab 07/31/15 1527 08/01/15 0136 08/02/15 0610  NA 139 137 138  K 3.4* 3.4* 4.6  CL 104 104 104  CO2 24 19* 25  GLUCOSE 100* 156* 110*  BUN CREATININE 0.70 0.80 0.63  CALCIUM 9.3 9.2 9.4  MG  --   --  2.1   GFR Estimated Creatinine Clearance: 95.8 mL/min (by C-G formula based on Cr of 0.63). Liver Function Tests: No results for input(s): AST, ALT, ALKPHOS, BILITOT, PROT, ALBUMIN in the last 168 hours. No results for input(s): LIPASE, AMYLASE in the last 168 hours. No results for input(s): AMMONIA in the last 168 hours. Coagulation profile No results for input(s): INR, PROTIME in the last 168 hours.  CBC:  Recent Labs Lab 07/31/15 1527 08/01/15 0136 08/02/15 0610  WBC 13.7* 16.1* 17.7*  NEUTROABS 10.7*  --   --   HGB 14.3 14.8 14.1  HCT 43.2 43.6 42.5  MCV 101.4* 100.2* 101.2*  PLT 397 410* 415*   D-Dimer:  Recent Labs  08/01/15 0136  DDIMER <0.27   Urine analysis:    Component Value Date/Time   COLORURINE GREEN* 07/31/2015 2247   APPEARANCEUR CLEAR 07/31/2015 2247   LABSPEC 1.013 07/31/2015 2247   PHURINE 5.5 07/31/2015 2247   GLUCOSEU 250* 07/31/2015 2247   HGBUR SMALL* 07/31/2015 2247   BILIRUBINUR NEGATIVE 07/31/2015 2247   KETONESUR 15* 07/31/2015 2247   PROTEINUR NEGATIVE 07/31/2015 2247   UROBILINOGEN 0.2 11/08/2014 0551   NITRITE NEGATIVE 07/31/2015 2247   LEUKOCYTESUR NEGATIVE 07/31/2015 2247   Microbiology No results found for this or any previous visit (from the past 240 hour(s)).  Radiology: Dg Neck Soft Tissue  07/31/2015  CLINICAL DATA:  Worsening asthma over the past 4 days. EXAM: NECK SOFT TISSUES - 1+ VIEW COMPARISON:  None. FINDINGS: Normal appearing airway and epiglottis. Mild reversal of the normal cervical  lordosis and mild multilevel cervical spine degenerative changes. IMPRESSION: No acute abnormality. Electronically Signed   By: Beckie Salts M.D.   On: 07/31/2015 18:17   Ct Chest High Resolution  08/01/2015  CLINICAL DATA:  40 year old female with severe asthma. Shortness of breath. Chest pressure and wheezing yesterday. EXAM: CT CHEST WITHOUT CONTRAST TECHNIQUE: Multidetector CT imaging of the chest was performed following the standard protocol without intravenous contrast. High resolution imaging of the lungs, as well as inspiratory and expiratory imaging, was performed. COMPARISON:  No priors. FINDINGS: Mediastinum/Lymph Nodes: Heart size is normal. There is no significant pericardial fluid, thickening or pericardial calcification. No pathologically enlarged mediastinal or hilar lymph nodes. Please note that accurate exclusion of hilar adenopathy is limited on noncontrast CT scans. Esophagus is unremarkable in appearance. No axillary lymphadenopathy. Lungs/Pleura: High-resolution images demonstrate no significant areas of ground-glass attenuation, subpleural reticulation, traction bronchiectasis or frank honeycombing. There are, however, numerous linear opacities and areas of architectural distortion in the lung bases bilaterally, predominantly in the lower lobes of the lungs, favored to reflect a combination of subsegmental atelectasis and chronic post infectious  or inflammatory scarring. Inspiratory and expiratory imaging is unremarkable. There is no acute consolidative airspace disease. No pleural effusions. No suspicious appearing pulmonary nodules or masses. Upper abdomen: Status post cholecystectomy. Musculoskeletal: There are no aggressive appearing lytic or blastic lesions noted in the visualized portions of the skeleton. IMPRESSION: 1. There is extensive atelectasis and/or post infectious/inflammatory scarring throughout the lung bases bilaterally, predominantly in the lower lobes of the lungs. 2. No  typical stigmata suggestive of interstitial lung disease at this time. Electronically Signed   By: Trudie Reedaniel  Entrikin M.D.   On: 08/01/2015 10:23    Medications:   . acidophilus  1 capsule Oral Daily  . amLODipine  5 mg Oral Daily  . budesonide (PULMICORT) nebulizer solution  0.5 mg Nebulization BID  . calcium-vitamin D  1 tablet Oral Q breakfast  . cholestyramine  4 g Oral BID  . clotrimazole  1 Applicatorful Vaginal QHS  . cycloSPORINE  2 drop Both Eyes BID  . enoxaparin (LOVENOX) injection  40 mg Subcutaneous Q24H  . fluticasone  1 spray Each Nare Daily  . ipratropium  1 spray Each Nare BID  . ipratropium-albuterol  3 mL Nebulization TID  . loratadine  10 mg Oral q morning - 10a  . losartan  50 mg Oral Daily  . magnesium oxide  400 mg Oral Daily  . [START ON 08/03/2015] methylPREDNISolone (SOLU-MEDROL) injection  40 mg Intravenous Q24H  . multivitamin with minerals  1 tablet Oral Daily  . pentosan polysulfate  100 mg Oral TID  . UROGESIC-BLUE  1 tablet Oral BID  . zileuton  1,200 mg Oral BID   Continuous Infusions:   Time spent: 25 minutes.     [facility] HospitalKULA,Anju Sereno  [facility] Pager [phone] *Please refer to [email or web address], password [email or web address] to get updated schedule on who will round on this patient, as hospitalists switch teams weekly. If 7PM-7AM, please contact night-coverage at [email or web address], password [email or web address] for any overnight needs.  08/02/2015, 5:22 PM

## 2015-08-02 NOTE — Progress Notes (Signed)
Name: Marissa Gibson MRN: [id number] DOB: [birth year]    ADMISSION DATE:  07/31/2015 CONSULTATION DATE:  07/31/2015  REFERRING MD :  EDP  CHIEF COMPLAINT:  SOB  BRIEF PATIENT DESCRIPTION: 40 year old female with history of asthma admitted with asthma exacerbation and upper airway wheeze. Refractory to steroids, bronchodilators, PCCM to see.   SIGNIFICANT EVENTS  4/24  Admit  STUDIES:  PFT 10/31/14 ([facility]) -FEV1 % pred 86% to 104% (+21%)  -FVC % pred 94% to 104% (+10%)  -FEV1/FVC % 75%  -FEF 25-75% pred 68% to 105% (+55%)    HISTORY OF PRESENT ILLNESS:  40 year old female with PMH as below, which includes uncomplicated severe persistent asthma (followed at [facility], Dr. Christell Constant), Interstitial cystitis, and endometriosis. For asthma she has recently started Nucala and has received 5 doses now (4/20 most recent). She did have some viral like syndrome after dose 4, and it is unclear whether they are related. She also receives kenolog injections every 2-3 weeks (4/20), Zyflo, Qvar, and Stiolto. She takes combivent and ventolin PRN. Typically she uses her rescue medications onlu 1-3 times per week, however,  4/22 she developed asthma exacerbation, which was repeatedly refractory to these therapies. Symptoms would minimally improve in between doses, but then worsen again requiring repeated use.  4/25 she presented to ED with complaints SOB, wheeze, and dry hacking cough. She was given steroids and bronchodilators without relief. ED noted wheeze to be located primarily upper airway at level of vocal cords and was concerned for stridor racemic epinephrine was given without much relief. PCCM consulted.    SUBJECTIVE:  Pt reports feeling better.  Did PFT's yesterday and was fatigued after the exertion of testing. Reports ongoing chest tightness but improved.  Pt is not able to articulate if she feels she has difficulty getting air in or out with attacks.     VITAL SIGNS: Temp:  [98.1 F (36.7 C)-98.7 F  (37.1 C)] 98.1 F (36.7 C) (04/26 0527) Pulse Rate:  [85-112] 85 (04/26 0527) Resp:  [18-20] 18 (04/26 0527) BP: (117-129)/(73-88) 129/88 mmHg (04/26 0527) SpO2:  [95 %-97 %] 96 % (04/26 0831)  PHYSICAL EXAMINATION: General:  Young adult female in NAD, sitting up in bed Neuro:  Alert, oriented, non-focal HEENT:  [place]/AT, PEERL, no JVD Cardiovascular:  S1S2 RRR, no MRG  Lungs: Negative wheezing upon auscultation, normal effort, R basilar crackles that do not clear with cough Abdomen:  Soft, non-tender, non-distended Musculoskeletal:  No acute deformity or ROM limitation Skin:  Grossly intact   Recent Labs Lab 07/31/15 1527 08/01/15 0136 08/02/15 0610  NA 139 137 138  K 3.4* 3.4* 4.6  CL 104 104 104  CO2 24 19* 25  BUN CREATININE 0.70 0.80 0.63  GLUCOSE 100* 156* 110*    Recent Labs Lab 07/31/15 1527 08/01/15 0136 08/02/15 0610  HGB 14.3 14.8 14.1  HCT 43.2 43.6 42.5  WBC 13.7* 16.1* 17.7*  PLT 397 410* 415*   Dg Neck Soft Tissue  07/31/2015  CLINICAL DATA:  Worsening asthma over the past 4 days. EXAM: NECK SOFT TISSUES - 1+ VIEW COMPARISON:  None. FINDINGS: Normal appearing airway and epiglottis. Mild reversal of the normal cervical lordosis and mild multilevel cervical spine degenerative changes. IMPRESSION: No acute abnormality. Electronically Signed   By: Beckie Salts M.D.   On: 07/31/2015 18:17   Ct Chest High Resolution  08/01/2015  CLINICAL DATA:  40 year old female with severe asthma. Shortness of breath. Chest pressure  and wheezing yesterday. EXAM: CT CHEST WITHOUT CONTRAST TECHNIQUE: Multidetector CT imaging of the chest was performed following the standard protocol without intravenous contrast. High resolution imaging of the lungs, as well as inspiratory and expiratory imaging, was performed. COMPARISON:  No priors. FINDINGS: Mediastinum/Lymph Nodes: Heart size is normal. There is no significant pericardial fluid, thickening or pericardial calcification.  No pathologically enlarged mediastinal or hilar lymph nodes. Please note that accurate exclusion of hilar adenopathy is limited on noncontrast CT scans. Esophagus is unremarkable in appearance. No axillary lymphadenopathy. Lungs/Pleura: High-resolution images demonstrate no significant areas of ground-glass attenuation, subpleural reticulation, traction bronchiectasis or frank honeycombing. There are, however, numerous linear opacities and areas of architectural distortion in the lung bases bilaterally, predominantly in the lower lobes of the lungs, favored to reflect a combination of subsegmental atelectasis and chronic post infectious or inflammatory scarring. Inspiratory and expiratory imaging is unremarkable. There is no acute consolidative airspace disease. No pleural effusions. No suspicious appearing pulmonary nodules or masses. Upper abdomen: Status post cholecystectomy. Musculoskeletal: There are no aggressive appearing lytic or blastic lesions noted in the visualized portions of the skeleton. IMPRESSION: 1. There is extensive atelectasis and/or post infectious/inflammatory scarring throughout the lung bases bilaterally, predominantly in the lower lobes of the lungs. 2. No typical stigmata suggestive of interstitial lung disease at this time. Electronically Signed   By: Trudie Reedaniel  Entrikin M.D.   On: 08/01/2015 10:23   Dg Chest Portable 1 View  07/31/2015  CLINICAL DATA:  Shortness of breath. EXAM: PORTABLE CHEST 1 VIEW COMPARISON:  July 04, 2015. FINDINGS: The heart size and mediastinal contours are within normal limits. Both lungs are clear. No pneumothorax or pleural effusion is noted. The visualized skeletal structures are unremarkable. IMPRESSION: No acute cardiopulmonary abnormality seen. Electronically Signed   By: Lupita RaiderJames  Green Jr, M.D.   On: 07/31/2015 15:25   STUDIES:   4/24  DG Soft Tissue of Neck >> no acute abnormality  4/25  High Resolution CT Chest >> extensive atelectasis, post  inflammatory scarring, no typical stigmata to suggest ILD   ASSESSMENT / PLAN:  Severe persistent asthma with acute exacerbation  Consider component vocal cord dysfunction or mechanical laryngeal abnormality Multiple Allergies  Plan: - Continue Scheduled duoneb/pulmicort - Continue PRN albuterol - Systemic steroids, with taper > decreased to 40 mg QD   - Defer further nucala to primary [facility]monologist > [facility] - Recommend anxiolytic, but patient feels as though she has had bad responses to these in past and would prefer not to take - Consider ENT assessment as an outpatient  - Consider SLP evaluation as an outpatient for VCD therapy, defer to primary pulmonary    Canary BrimBrandi Dasani Crear, NP-C [facility] Pgr: [phone] or if no answer [phone] 08/02/2015, 11:38 AM

## 2015-08-03 DIAGNOSIS — J45901 Unspecified asthma with (acute) exacerbation: Secondary | ICD-10-CM | POA: Diagnosis not present

## 2015-08-03 MED ORDER — PREDNISONE 20 MG PO TABS: 40.0000 mg | ORAL_TABLET | Freq: Every day | ORAL | Status: DC

## 2015-08-03 NOTE — Progress Notes (Signed)
Marissa Gibson to be D/C'd Home per MD order.  Discussed with the patient and all questions fully answered.  VSS, Skin clean, dry and intact without evidence of skin break down, no evidence of skin tears noted. IV catheter discontinued intact. Site without signs and symptoms of complications. Dressing and pressure applied.  An After Visit Summary was printed and given to the patient. Patient received prescription.  D/c education completed with patient/family including follow up instructions, medication list, d/c activities limitations if indicated, with other d/c instructions as indicated by MD - patient able to verbalize understanding, all questions fully answered.   Patient instructed to return to ED, call 911, or call MD for any changes in condition.   Patient escorted via WC, and D/C home via private auto.  [name] 08/03/2015 11:56 AM

## 2015-08-03 NOTE — Discharge Summary (Signed)
Physician Discharge Summary  Marissa Gibson ZOX:[id number] DOB: 1975-12-28 DOA: 07/31/2015  PCP: Thora Lance, MD  Admit date: 07/31/2015 Discharge date: 08/03/2015  Time spent: 30 minutes  Recommendations for Outpatient Follow-up:  1. Follow up with pulmonology next week.    Discharge Diagnoses:  Principal Problem:   Asthma exacerbation Active Problems:   Stridor   Leukocytosis   Sinus tachycardia (HCC)   Hypokalemia   Interstitial cystitis   Essential hypertension   SIRS (systemic inflammatory response syndrome) (HCC)   Discharge Condition: improved  Diet recommendation: regular  Filed Weights   08/01/15 0130  Weight: 71.94 kg (158 lb 9.6 oz)    History of present illness:  Marissa Gibson is an 40 y.o. female with a PMH of uncomplicated severe persistent asthma, interstitial cystitis, and endometriosis, recently started on Nucala by her pulmonologist with last dose given 07/27/15 who developed a viral-like syndrome after her fourth dose out of 5. Her asthma is also managed with kenolog injections every 2-3 weeks (last dose 07/27/15), Zyflo, Qvar, and Stiolto as well as Combivent and Ventolin as needed. She was admitted 07/31/15 with persistent shortness of breath, wheezing and a dry hacking cough. Treated with steroids and bronchodilators in the ED without significant improvement and therefore admitted to the hospital. Pulmonology was consulted for evaluation of these symptoms as well as stridor.  Hospital Course:  Asthma exacerbation with stridor:  Admission evaluation included a CT of the neck which was clear and a chest x-ray which was clear. Pulmonology consulted for evaluation of symptoms including stridor. Spirometry ordered with recommendations to pursue ENT consult to evaluate for vocal cord dysfunction versus anatomic pathology. High-resolution CT of the chest showed extensive atelectasis and/or post infectious/inflammatory scarring throughout the lung bases bilaterally,  but no evidence of interstitial lung disease.outpatient ENT eval and follow upw ith pulmonology next week. She will be discharged on oral prednisone 40 mg daily.   Active Problems:  Leukocytosis/SIRS Urinalysis and chest x-ray clear. Suspect leukocytosis secondary to steroids and elevated heart rate, respiratory rate secondary to bronchospasm. No evidence of sepsis/infectious etiology.   Sinus tachycardia (HCC) Likely secondary to bronchodilator treatment.improved.    Hypokalemia Supplemented.    Interstitial cystitis Continue pentosan and Urogesic-blue.   Essential Hypertension Continue Norvasc.  Procedures:  PFT'S  Consultations:  pccm  Discharge Exam: Filed Vitals:   08/02/15 2240 08/03/15 0622  BP: 106/68 120/78  Pulse: 83 77  Temp: 98.1 F (36.7 C) 98.4 F (36.9 C)  Resp: 17 16    General: alert comfortable.  Cardiovascular: s1s2 Respiratory: ctab, no wheezing heard.   Discharge Instructions   Discharge Instructions    Diet - low sodium heart healthy    Complete by:  As directed      Discharge instructions    Complete by:  As directed   Please follow up with pulmonologist in one week.          Discharge Medication List as of 08/03/2015 11:54 AM    START taking these medications   Details  predniSONE (DELTASONE) 20 MG tablet Take 2 tablets (40 mg total) by mouth daily with breakfast., Starting 08/03/2015, Until Discontinued, Print      CONTINUE these medications which have NOT CHANGED   Details  acetaminophen (TYLENOL) 325 MG tablet Take 650 mg by mouth every 6 (six) hours as needed for mild pain., Until Discontinued, Historical Med    albuterol (PROVENTIL HFA;VENTOLIN HFA) 108 (90 BASE) MCG/ACT inhaler Inhale 1 puff into the lungs every 6 (six)  hours as needed for wheezing or shortness of breath., Until Discontinued, Historical Med    albuterol (PROVENTIL) (2.5 MG/3ML) 0.083% nebulizer solution Take 2.5 mg by nebulization every 8 (eight)  hours as needed for wheezing or shortness of breath., Until Discontinued, Historical Med    amLODipine (NORVASC) 5 MG tablet Take 5 mg by mouth daily., Until Discontinued, Historical Med    B Complex-C (SUPER B COMPLEX/VITAMIN C PO) Take 1 tablet by mouth daily., Until Discontinued, Historical Med    beclomethasone (QVAR) 80 MCG/ACT inhaler Inhale 2 puffs into the lungs 3 (three) times daily., Until Discontinued, Historical Med    Beclomethasone Dipropionate (QNASL) 80 MCG/ACT AERS Place 1 spray into the nose 2 (two) times daily. , Until Discontinued, Historical Med    bupivacaine (MARCAINE) 0.5 % SOLN injection 10 mLs by Infiltration route See admin instructions. 2-3 times per day combined with Sodium Bicarbonate for bladder instillation, Until Discontinued, Historical Med    calcium-vitamin D (OSCAL WITH D) 500-200 MG-UNIT per tablet Take 1 tablet by mouth daily with breakfast. , Until Discontinued, Historical Med    cholestyramine (QUESTRAN) 4 g packet Take 4 g by mouth 2 (two) times daily., Until Discontinued, Historical Med    CRANBERRY EXTRACT PO Take 1 tablet by mouth daily. , Until Discontinued, Historical Med    cycloSPORINE (RESTASIS) 0.05 % ophthalmic emulsion Place 2 drops into both eyes 2 (two) times daily., Until Discontinued, Historical Med    estradiol (CLIMARA - DOSED IN MG/24 HR) 0.0375 mg/24hr patch Place 0.0375 mg onto the skin every Sunday. , Until Discontinued, Historical Med    FIBER PO Take 2 tablets by mouth daily., Until Discontinued, Historical Med    glucosamine-chondroitin (COSAMIN DS) 500-400 MG tablet Take 1 tablet by mouth daily., Until Discontinued, Historical Med    guaiFENesin (MUCINEX) 600 MG 12 hr tablet Take 600 mg by mouth 2 (two) times daily as needed for cough., Until Discontinued, Historical Med    HYDROcodone-acetaminophen (NORCO/VICODIN) 5-325 MG tablet Take 1 tablet by mouth every 6 (six) hours as needed for moderate pain., Until Discontinued,  Historical Med    ipratropium (ATROVENT) 0.06 % nasal spray Place 1 spray into both nostrils 2 (two) times daily. , Until Discontinued, Historical Med    Ipratropium-Albuterol (COMBIVENT RESPIMAT) 20-100 MCG/ACT AERS respimat Inhale 1 puff into the lungs every 6 (six) hours as needed for wheezing or shortness of breath., Until Discontinued, Historical Med    levocetirizine (XYZAL) 5 MG tablet Take 5 mg by mouth every morning. , Until Discontinued, Historical Med    levonorgestrel (MIRENA) 20 MCG/24HR IUD 1 each by Intrauterine route once., Historical Med    losartan (COZAAR) 50 MG tablet Take 50 mg by mouth daily., Until Discontinued, Historical Med    magnesium oxide (MAG-OX) 400 MG tablet Take 400 mg by mouth daily., Until Discontinued, Historical Med    Methen-Hyosc-Meth Blue-Na Phos (UROGESIC-BLUE) 81.6 MG TABS Take 1 tablet by mouth See admin instructions. Takes 2-3 times daily, Until Discontinued, Historical Med    Multiple Vitamin (MULTIVITAMIN WITH MINERALS) TABS tablet Take 1 tablet by mouth daily., Until Discontinued, Historical Med    pentosan polysulfate (ELMIRON) 100 MG capsule Take 100 mg by mouth 3 (three) times daily., Until Discontinued, Historical Med    Probiotic Product (PROBIOTIC FORMULA) CAPS Take 1 capsule by mouth daily., Until Discontinued, Historical Med    terconazole (TERAZOL 7) 0.4 % vaginal cream Place 1 applicator vaginally at bedtime. Started 4/23 for 7 days ending 4/30, Until  Discontinued, Historical Med    Tiotropium Bromide-Olodaterol (STIOLTO RESPIMAT) 2.5-2.5 MCG/ACT AERS Inhale 1 puff into the lungs 2 (two) times daily., Until Discontinued, Historical Med    zileuton (ZYFLO CR) 600 MG CR tablet Take 1,200 mg by mouth 2 (two) times daily., Until Discontinued, Historical Med    EPINEPHrine (EPIPEN 2-PAK) 0.3 mg/0.3 mL IJ SOAJ injection Inject 0.3 mg into the muscle as needed (for anaphylaxis)., Until Discontinued, Historical Med      STOP taking  these medications     heparin [id number] UNIT/ML injection      Mepolizumab (NUCALA) 100 MG SOLR      metaxalone (SKELAXIN) 800 MG tablet      SODIUM BICARBONATE IV      Triamcinolone Acetonide (KENALOG IJ)        Allergies  Allergen Reactions  . 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 Other Nausea And Vomiting    anesthesia-severe vomiting with and after sedation Cats, dogs, ragweed, mold, cockroaches, dust, grass pollens, perfumes and polyester-unspecified allergens  . Xolair [Omalizumab] Hives  . Amitriptyline Other (See Comments)    Other reaction(s): GI Upset (intolerance)  . Duloxetine Nausea And Vomiting and Other (See Comments)    Headaches, nausea & vomiting, vertigo  . Ibuprofen Other (See Comments)    Limit ibuprofen type of meds-per MD  . Nsaids Other (See Comments)    Limit use of NSAIDS-per MD  . Lyman BishopSudafed [Pseudoephedrine Hcl] Other (See Comments)    lethargic  . Tape Rash    Steri-strips, bandaids, etc  . Ceclor [Cefaclor] Rash  . Cymbalta [Duloxetine Hcl] Other (See Comments)  . Neomycin-Bacitracin Zn-Polymyx Rash      The results of significant diagnostics from this hospitalization (including imaging, microbiology, ancillary and laboratory) are listed below for reference.    Significant Diagnostic Studies: Dg Neck Soft Tissue  07/31/2015  CLINICAL DATA:  Worsening asthma over the past 4 days. EXAM: NECK SOFT TISSUES - 1+ VIEW COMPARISON:  None. FINDINGS: Normal appearing airway and epiglottis. Mild reversal of the normal cervical lordosis and mild multilevel cervical spine degenerative changes. IMPRESSION: No acute abnormality. Electronically Signed   By: Beckie SaltsSteven  Reid M.D.   On: 07/31/2015 18:17   Ct Chest High Resolution  08/01/2015  CLINICAL DATA:  40 year old female with severe asthma. Shortness of breath. Chest pressure and wheezing yesterday. EXAM: CT CHEST WITHOUT CONTRAST TECHNIQUE:  Multidetector CT imaging of the chest was performed following the standard protocol without intravenous contrast. High resolution imaging of the lungs, as well as inspiratory and expiratory imaging, was performed. COMPARISON:  No priors. FINDINGS: Mediastinum/Lymph Nodes: Heart size is normal. There is no significant pericardial fluid, thickening or pericardial calcification. No pathologically enlarged mediastinal or hilar lymph nodes. Please note that accurate exclusion of hilar adenopathy is limited on noncontrast CT scans. Esophagus is unremarkable in appearance. No axillary lymphadenopathy. Lungs/Pleura: High-resolution images demonstrate no significant areas of ground-glass attenuation, subpleural reticulation, traction bronchiectasis or frank honeycombing. There are, however, numerous linear opacities and areas of architectural distortion in the lung bases bilaterally, predominantly in the lower lobes of the lungs, favored to reflect a combination of subsegmental atelectasis and chronic post infectious or inflammatory scarring. Inspiratory and expiratory imaging is unremarkable. There is no acute consolidative airspace disease. No pleural effusions. No suspicious appearing pulmonary nodules or masses. Upper abdomen: Status post cholecystectomy. Musculoskeletal: There are no aggressive appearing lytic or blastic lesions noted in the visualized portions of the  skeleton. IMPRESSION: 1. There is extensive atelectasis and/or post infectious/inflammatory scarring throughout the lung bases bilaterally, predominantly in the lower lobes of the lungs. 2. No typical stigmata suggestive of interstitial lung disease at this time. Electronically Signed   By: Trudie Reed M.D.   On: 08/01/2015 10:23   Dg Chest Portable 1 View  07/31/2015  CLINICAL DATA:  Shortness of breath. EXAM: PORTABLE CHEST 1 VIEW COMPARISON:  July 04, 2015. FINDINGS: The heart size and mediastinal contours are within normal limits. Both lungs  are clear. No pneumothorax or pleural effusion is noted. The visualized skeletal structures are unremarkable. IMPRESSION: No acute cardiopulmonary abnormality seen. Electronically Signed   By: Lupita Raider, M.D.   On: 07/31/2015 15:25    Microbiology: No results found for this or any previous visit (from the past 240 hour(s)).   Labs: Basic Metabolic Panel:  Recent Labs Lab 07/31/15 1527 08/01/15 0136 08/02/15 0610  NA 139 137 138  K 3.4* 3.4* 4.6  CL 104 104 104  CO2 24 19* 25  GLUCOSE 100* 156* 110*  BUN CREATININE 0.70 0.80 0.63  CALCIUM 9.3 9.2 9.4  MG  --   --  2.1   Liver Function Tests: No results for input(s): AST, ALT, ALKPHOS, BILITOT, PROT, ALBUMIN in the last 168 hours. No results for input(s): LIPASE, AMYLASE in the last 168 hours. No results for input(s): AMMONIA in the last 168 hours. CBC:  Recent Labs Lab 07/31/15 1527 08/01/15 0136 08/02/15 0610  WBC 13.7* 16.1* 17.7*  NEUTROABS 10.7*  --   --   HGB 14.3 14.8 14.1  HCT 43.2 43.6 42.5  MCV 101.4* 100.2* 101.2*  PLT 397 410* 415*   Cardiac Enzymes: No results for input(s): CKTOTAL, CKMB, CKMBINDEX, TROPONINI in the last 168 hours. BNP: BNP (last 3 results) No results for input(s): BNP in the last 8760 hours.  ProBNP (last 3 results) No results for input(s): PROBNP in the last 8760 hours.  CBG: No results for input(s): GLUCAP in the last 168 hours.     SignedKathlen Mody MD.  [facility] 08/03/2015, 7:42 PM

## 2015-08-03 NOTE — Progress Notes (Signed)
Name: Marissa Gibson MRN: [id number] DOB: April 30, 1975    ADMISSION DATE:  07/31/2015 CONSULTATION DATE:  07/31/2015  REFERRING MD :  EDP  CHIEF COMPLAINT:  SOB    HISTORY OF PRESENT ILLNESS:  40 year old female with PMH as below, which includes uncomplicated severe persistent [facility] Dr. Christell ConstantMoore), Interstitial cystitis, and endometriosis. For asthma she has recently started Nucala and has received 5 doses now (4/20 most recent). She did have some viral like syndrome after dose 4, and it is unclear whether they are related. She also receives kenolog injections every 2-3 weeks (4/20), Zyflo, Qvar, and Stiolto. She takes combivent and ventolin PRN. Typically she uses her rescue medications onlu 1-3 times per week, however,  4/22 she developed asthma exacerbation, which was repeatedly refractory to these therapies. Symptoms would minimally improve in between doses, but then worsen again requiring repeated use.  4/25 she presented to ED with complaints SOB, wheeze, and dry hacking cough. She was given steroids and bronchodilators without relief. ED noted wheeze to be located primarily upper airway at level of vocal cords and was concerned for stridor racemic epinephrine was given without much relief. PCCM consulted.    SUBJECTIVE:  Approaching baseline of difficult to control asthma.  VITAL SIGNS: Temp:  [97.7 F (36.5 C)-98.4 F (36.9 C)] 98.4 F (36.9 C) (04/27 0622) Pulse Rate:  [77-103] 77 (04/27 0622) Resp:  [16-20] 16 (04/27 0622) BP: (106-121)/(68-78) 120/78 mmHg (04/27 0622) SpO2:  [96 %-98 %] 96 % (04/27 0622)  PHYSICAL EXAMINATION: General:  Female of normal body habitus in no distress. Speech clear Neuro:  Alert, oriented, non-focal HEENT:  [place]/AT, PEERL, no JVD Cardiovascular:   regular, no MRG  Lungs: Negative wheezing upon auscultated , regular rate and effort, decreased bs rt base Abdomen:  Soft, non-tender, non-distended Musculoskeletal:  No acute  deformity or ROM limitation Skin:  Grossly intact   Recent Labs Lab 07/31/15 1527 08/01/15 0136 08/02/15 0610  NA 139 137 138  K 3.4* 3.4* 4.6  CL 104 104 104  CO2 24 19* 25  BUN 8 6 11   CREATININE 0.70 0.80 0.63  GLUCOSE 100* 156* 110*    Recent Labs Lab 07/31/15 1527 08/01/15 0136 08/02/15 0610  HGB 14.3 14.8 14.1  HCT 43.2 43.6 42.5  WBC 13.7* 16.1* 17.7*  PLT 397 410* 415*   Ct Chest High Resolution  08/01/2015  CLINICAL DATA:  40 year old female with severe asthma. Shortness of breath. Chest pressure and wheezing yesterday. EXAM: CT CHEST WITHOUT CONTRAST TECHNIQUE: Multidetector CT imaging of the chest was performed following the standard protocol without intravenous contrast. High resolution imaging of the lungs, as well as inspiratory and expiratory imaging, was performed. COMPARISON:  No priors. FINDINGS: Mediastinum/Lymph Nodes: Heart size is normal. There is no significant pericardial fluid, thickening or pericardial calcification. No pathologically enlarged mediastinal or hilar lymph nodes. Please note that accurate exclusion of hilar adenopathy is limited on noncontrast CT scans. Esophagus is unremarkable in appearance. No axillary lymphadenopathy. Lungs/Pleura: High-resolution images demonstrate no significant areas of ground-glass attenuation, subpleural reticulation, traction bronchiectasis or frank honeycombing. There are, however, numerous linear opacities and areas of architectural distortion in the lung bases bilaterally, predominantly in the lower lobes of the lungs, favored to reflect a combination of subsegmental atelectasis and chronic post infectious or inflammatory scarring. Inspiratory and expiratory imaging is unremarkable. There is no acute consolidative airspace disease. No pleural effusions. No suspicious appearing pulmonary nodules or masses. Upper abdomen: Status post cholecystectomy.  Musculoskeletal: There are no aggressive appearing lytic or blastic  lesions noted in the visualized portions of the skeleton. IMPRESSION: 1. There is extensive atelectasis and/or post infectious/inflammatory scarring throughout the lung bases bilaterally, predominantly in the lower lobes of the lungs. 2. No typical stigmata suggestive of interstitial lung disease at this time. Electronically Signed   By: Trudie Reed M.D.   On: 08/01/2015 10:23   Significant tests/ events  HRCT chest shows minimal bibasal scarring probably postinfectious  Spirometry-shows no evidence of airway obstruction, no flattening of the flow volume loop  ASSESSMENT / PLAN:  Severe persistent asthma with acute exacerbation Consider component vocal cord dysfunction or mechanical laryngeal abnormality - Supplemental O2 as needed to keep SpO2 > 92% - Continue Scheduled duoneb/pulmicort - Continue PRN albuterol - Systemic steroids ( decreased to 40 q 24 , no audible wheezing ) -Prednisone at 50 mg daily till seen by Dr. Christell Constant as Opt. Would not taper her. - Defer further nucala to primary pulmonologist - will have to be dc'd given her reaction - Consider  ENT Evaluation as outpatient after eval by [facility] pulmonary Dr. Christell Constant to make final decision as she knows this patient well.  - Appears to be at [facility] 4/27.  Brett Canales Minor ACNP [facility] PCCM Pager [phone] till 3 pm If no answer page [phone] 08/03/2015, 9:37 AM

## 2015-09-16 ENCOUNTER — Observation Stay (HOSPITAL_COMMUNITY): Payer: BLUE CROSS/BLUE SHIELD

## 2015-09-16 ENCOUNTER — Emergency Department (HOSPITAL_COMMUNITY): Payer: BLUE CROSS/BLUE SHIELD

## 2015-09-16 ENCOUNTER — Encounter (HOSPITAL_COMMUNITY): Payer: Self-pay

## 2015-09-16 ENCOUNTER — Inpatient Hospital Stay (HOSPITAL_COMMUNITY)
Admission: EM | Admit: 2015-09-16 | Discharge: 2015-09-18 | DRG: 092 | Disposition: A | Discharge status: Home / Self Care | Payer: BLUE CROSS/BLUE SHIELD | Attending: Internal Medicine | Admitting: Internal Medicine

## 2015-09-16 DIAGNOSIS — G92 Toxic encephalopathy: Secondary | ICD-10-CM | POA: Diagnosis not present

## 2015-09-16 DIAGNOSIS — J45909 Unspecified asthma, uncomplicated: Secondary | ICD-10-CM | POA: Diagnosis present

## 2015-09-16 DIAGNOSIS — D72829 Elevated white blood cell count, unspecified: Secondary | ICD-10-CM | POA: Diagnosis present

## 2015-09-16 DIAGNOSIS — Z886 Allergy status to analgesic agent status: Secondary | ICD-10-CM

## 2015-09-16 DIAGNOSIS — J4550 Severe persistent asthma, uncomplicated: Secondary | ICD-10-CM | POA: Diagnosis present

## 2015-09-16 DIAGNOSIS — T380X5A Adverse effect of glucocorticoids and synthetic analogues, initial encounter: Secondary | ICD-10-CM | POA: Diagnosis present

## 2015-09-16 DIAGNOSIS — G9340 Encephalopathy, unspecified: Secondary | ICD-10-CM | POA: Diagnosis not present

## 2015-09-16 DIAGNOSIS — Z91018 Allergy to other foods: Secondary | ICD-10-CM

## 2015-09-16 DIAGNOSIS — I10 Essential (primary) hypertension: Secondary | ICD-10-CM | POA: Diagnosis not present

## 2015-09-16 DIAGNOSIS — Z7951 Long term (current) use of inhaled steroids: Secondary | ICD-10-CM

## 2015-09-16 DIAGNOSIS — N3010 Interstitial cystitis (chronic) without hematuria: Secondary | ICD-10-CM | POA: Diagnosis present

## 2015-09-16 DIAGNOSIS — Z79899 Other long term (current) drug therapy: Secondary | ICD-10-CM

## 2015-09-16 DIAGNOSIS — R5381 Other malaise: Secondary | ICD-10-CM | POA: Diagnosis present

## 2015-09-16 DIAGNOSIS — Z7952 Long term (current) use of systemic steroids: Secondary | ICD-10-CM

## 2015-09-16 DIAGNOSIS — Z888 Allergy status to other drugs, medicaments and biological substances status: Secondary | ICD-10-CM

## 2015-09-16 DIAGNOSIS — R4182 Altered mental status, unspecified: Secondary | ICD-10-CM

## 2015-09-16 DIAGNOSIS — B370 Candidal stomatitis: Secondary | ICD-10-CM | POA: Diagnosis present

## 2015-09-16 DIAGNOSIS — E876 Hypokalemia: Secondary | ICD-10-CM | POA: Diagnosis present

## 2015-09-16 DIAGNOSIS — Z881 Allergy status to other antibiotic agents status: Secondary | ICD-10-CM

## 2015-09-16 DIAGNOSIS — N809 Endometriosis, unspecified: Secondary | ICD-10-CM | POA: Diagnosis present

## 2015-09-16 LAB — CBC WITH DIFFERENTIAL/PLATELET
BASOS ABS: 0 10*3/uL (ref 0.0–0.1)
Basophils Relative: 0 %
EOS PCT: 0 %
Eosinophils Absolute: 0 10*3/uL (ref 0.0–0.7)
HEMATOCRIT: 43.4 % (ref 36.0–46.0)
HEMOGLOBIN: 14.4 g/dL (ref 12.0–15.0)
LYMPHS ABS: 1.3 10*3/uL (ref 0.7–4.0)
LYMPHS PCT: 9 %
MCH: 32.6 pg (ref 26.0–34.0)
MCHC: 33.2 g/dL (ref 30.0–36.0)
MCV: 98.2 fL (ref 78.0–100.0)
MONOS PCT: 8 %
Monocytes Absolute: 1.2 10*3/uL — ABNORMAL HIGH (ref 0.1–1.0)
NEUTROS ABS: 12.1 10*3/uL — AB (ref 1.7–7.7)
Neutrophils Relative %: 83 %
Platelets: 154 10*3/uL (ref 150–400)
RBC: 4.42 MIL/uL (ref 3.87–5.11)
RDW: 12.8 % (ref 11.5–15.5)
WBC: 14.6 10*3/uL — ABNORMAL HIGH (ref 4.0–10.5)

## 2015-09-16 LAB — ACETAMINOPHEN LEVEL: Acetaminophen (Tylenol), Serum: <10 ug/mL — ABNORMAL LOW (ref 10–30)

## 2015-09-16 LAB — RAPID URINE DRUG SCREEN, HOSP PERFORMED
AMPHETAMINES: NOT DETECTED
BENZODIAZEPINES: NOT DETECTED
Barbiturates: NOT DETECTED
COCAINE: NOT DETECTED
OPIATES: NOT DETECTED
Tetrahydrocannabinol: NOT DETECTED

## 2015-09-16 LAB — COMPREHENSIVE METABOLIC PANEL
ALBUMIN: 3.5 g/dL (ref 3.5–5.0)
ALK PHOS: 51 U/L (ref 38–126)
ALT: 34 U/L (ref 14–54)
AST: 19 U/L (ref 15–41)
Anion gap: 9 (ref 5–15)
BUN: 22 mg/dL — ABNORMAL HIGH (ref 6–20)
CALCIUM: 9.4 mg/dL (ref 8.9–10.3)
CHLORIDE: 105 mmol/L (ref 101–111)
CO2: 23 mmol/L (ref 22–32)
CREATININE: 0.61 mg/dL (ref 0.44–1.00)
Glucose, Bld: 98 mg/dL (ref 65–99)
Potassium: 3.8 mmol/L (ref 3.5–5.1)
Sodium: 137 mmol/L (ref 135–145)
Total Bilirubin: 1 mg/dL (ref 0.3–1.2)
Total Protein: 5.8 g/dL — ABNORMAL LOW (ref 6.5–8.1)

## 2015-09-16 LAB — I-STAT ARTERIAL BLOOD GAS, ED
Bicarbonate: 23.8 mEq/L (ref 20.0–24.0)
O2 Saturation: 96 %
PCO2 ART: 35.3 mmHg (ref 35.0–45.0)
PH ART: 7.437 (ref 7.350–7.450)
TCO2: 25 mmol/L (ref 0–100)
pO2, Arterial: 82 mmHg (ref 80.0–100.0)

## 2015-09-16 LAB — CSF CELL COUNT WITH DIFFERENTIAL
RBC COUNT CSF: 16 /mm3 — AB
TUBE #: 1
WBC, CSF: 2 /mm3 (ref 0–5)

## 2015-09-16 LAB — BRAIN NATRIURETIC PEPTIDE: B NATRIURETIC PEPTIDE 5: 38.9 pg/mL (ref 0.0–100.0)

## 2015-09-16 LAB — URINALYSIS, ROUTINE W REFLEX MICROSCOPIC
Bilirubin Urine: NEGATIVE
Glucose, UA: NEGATIVE mg/dL
Ketones, ur: NEGATIVE mg/dL
LEUKOCYTES UA: NEGATIVE
Nitrite: NEGATIVE
PROTEIN: NEGATIVE mg/dL
SPECIFIC GRAVITY, URINE: 1.006 (ref 1.005–1.030)
pH: 7 (ref 5.0–8.0)

## 2015-09-16 LAB — URINE MICROSCOPIC-ADD ON

## 2015-09-16 LAB — MAGNESIUM: MAGNESIUM: 2.3 mg/dL (ref 1.7–2.4)

## 2015-09-16 LAB — SALICYLATE LEVEL

## 2015-09-16 LAB — PROTEIN AND GLUCOSE, CSF
Glucose, CSF: 70 mg/dL (ref 40–70)
Total  Protein, CSF: 50 mg/dL — ABNORMAL HIGH (ref 15–45)

## 2015-09-16 LAB — TROPONIN I

## 2015-09-16 LAB — PROCALCITONIN

## 2015-09-16 LAB — I-STAT BETA HCG BLOOD, ED (MC, WL, AP ONLY): I-stat hCG, quantitative: <5 m[IU]/mL (ref <5)

## 2015-09-16 LAB — LACTIC ACID, PLASMA: LACTIC ACID, VENOUS: 2.3 mmol/L — AB (ref 0.5–2.0)

## 2015-09-16 LAB — CRYPTOCOCCAL ANTIGEN, CSF: CRYPTO AG: NEGATIVE

## 2015-09-16 LAB — ETHANOL: Alcohol, Ethyl (B): <5 mg/dL (ref <5)

## 2015-09-16 MED ORDER — PROPOFOL 10 MG/ML IV BOLUS
INTRAVENOUS | Status: AC | PRN
  Administered 2015-09-16: 40 mg via INTRAVENOUS

## 2015-09-16 MED ORDER — ACETAMINOPHEN 650 MG RE SUPP: 650.0000 mg | Freq: Four times a day (QID) | RECTAL | Status: DC | PRN

## 2015-09-16 MED ORDER — PENTOSAN POLYSULFATE SODIUM 100 MG PO CAPS
100.0000 mg | ORAL_CAPSULE | Freq: Three times a day (TID) | ORAL | Status: DC
  Administered 2015-09-16 – 2015-09-18 (×6): 100 mg via ORAL
  Filled 2015-09-16 (×7): qty 1

## 2015-09-16 MED ORDER — GUAIFENESIN ER 600 MG PO TB12: 600.0000 mg | ORAL_TABLET | Freq: Two times a day (BID) | ORAL | Status: DC | PRN

## 2015-09-16 MED ORDER — HYDROCORTISONE 2.5 % RE CREA
TOPICAL_CREAM | Freq: Four times a day (QID) | RECTAL | Status: DC
  Administered 2015-09-16: 1 via RECTAL
  Administered 2015-09-17: 15:00:00 via RECTAL
  Administered 2015-09-17: 1 via RECTAL
  Administered 2015-09-17: 19:00:00 via RECTAL
  Administered 2015-09-18: 1 via RECTAL
  Administered 2015-09-18: 14:00:00 via RECTAL
  Filled 2015-09-16: qty 28.35

## 2015-09-16 MED ORDER — LEVOCETIRIZINE DIHYDROCHLORIDE 5 MG PO TABS: 5.0000 mg | ORAL_TABLET | Freq: Every morning | ORAL | Status: DC

## 2015-09-16 MED ORDER — ZILEUTON ER 600 MG PO TB12: 1200.0000 mg | ORAL_TABLET | Freq: Two times a day (BID) | ORAL | Status: DC

## 2015-09-16 MED ORDER — FLUTICASONE PROPIONATE 50 MCG/ACT NA SUSP
2.0000 | Freq: Every day | NASAL | Status: DC
  Administered 2015-09-17: 2 via NASAL
  Filled 2015-09-16 (×2): qty 16

## 2015-09-16 MED ORDER — ACETAMINOPHEN 325 MG PO TABS
650.0000 mg | ORAL_TABLET | Freq: Four times a day (QID) | ORAL | Status: DC | PRN
  Administered 2015-09-17: 650 mg via ORAL
  Filled 2015-09-16: qty 2

## 2015-09-16 MED ORDER — CHOLESTYRAMINE 4 G PO PACK
4.0000 g | PACK | Freq: Two times a day (BID) | ORAL | Status: DC
  Administered 2015-09-17 – 2015-09-18 (×4): 4 g via ORAL
  Filled 2015-09-16 (×4): qty 1

## 2015-09-16 MED ORDER — METHYLPREDNISOLONE SODIUM SUCC 125 MG IJ SOLR
60.0000 mg | Freq: Two times a day (BID) | INTRAMUSCULAR | Status: DC
  Administered 2015-09-17: 60 mg via INTRAVENOUS
  Filled 2015-09-16: qty 2

## 2015-09-16 MED ORDER — ENOXAPARIN SODIUM 40 MG/0.4ML ~~LOC~~ SOLN
40.0000 mg | SUBCUTANEOUS | Status: DC
  Administered 2015-09-16 – 2015-09-17 (×2): 40 mg via SUBCUTANEOUS
  Filled 2015-09-16 (×2): qty 0.4

## 2015-09-16 MED ORDER — MAGNESIUM OXIDE 400 (241.3 MG) MG PO TABS
400.0000 mg | ORAL_TABLET | Freq: Every day | ORAL | Status: DC
Start: 2015-09-16 — End: 2015-09-18
  Administered 2015-09-17 – 2015-09-18 (×2): 400 mg via ORAL
  Filled 2015-09-16 (×2): qty 1

## 2015-09-16 MED ORDER — MIDAZOLAM HCL 2 MG/2ML IJ SOLN
INTRAMUSCULAR | Status: AC
  Filled 2015-09-16: qty 2

## 2015-09-16 MED ORDER — MIDAZOLAM HCL 2 MG/2ML IJ SOLN
4.0000 mg | Freq: Once | INTRAMUSCULAR | Status: AC
  Administered 2015-09-16: 4 mg via INTRAVENOUS
  Filled 2015-09-16: qty 4

## 2015-09-16 MED ORDER — IPRATROPIUM BROMIDE 0.06 % NA SOLN
1.0000 | Freq: Two times a day (BID) | NASAL | Status: DC
  Administered 2015-09-16 – 2015-09-17 (×3): 1 via NASAL
  Filled 2015-09-16: qty 15

## 2015-09-16 MED ORDER — ESTRADIOL 0.0375 MG/24HR TD PTWK: 0.0375 mg | MEDICATED_PATCH | TRANSDERMAL | Status: DC

## 2015-09-16 MED ORDER — IPRATROPIUM-ALBUTEROL 0.5-2.5 (3) MG/3ML IN SOLN: 3.0000 mL | Freq: Four times a day (QID) | RESPIRATORY_TRACT | Status: DC | PRN

## 2015-09-16 MED ORDER — URELLE 81 MG PO TABS
1.0000 | ORAL_TABLET | Freq: Two times a day (BID) | ORAL | Status: DC
  Administered 2015-09-16 – 2015-09-18 (×4): 81 mg via ORAL
  Filled 2015-09-16 (×5): qty 1

## 2015-09-16 MED ORDER — CLOTRIMAZOLE 1 % VA CREA
1.0000 | TOPICAL_CREAM | Freq: Every day | VAGINAL | Status: DC
  Administered 2015-09-17: 1 via VAGINAL
  Filled 2015-09-16: qty 45

## 2015-09-16 MED ORDER — SACCHAROMYCES BOULARDII 250 MG PO CAPS
250.0000 mg | ORAL_CAPSULE | Freq: Every day | ORAL | Status: DC
  Administered 2015-09-17: 250 mg via ORAL
  Filled 2015-09-16 (×2): qty 1

## 2015-09-16 MED ORDER — AMLODIPINE BESYLATE 5 MG PO TABS
5.0000 mg | ORAL_TABLET | Freq: Every day | ORAL | Status: DC
  Administered 2015-09-17 – 2015-09-18 (×2): 5 mg via ORAL
  Filled 2015-09-16 (×2): qty 1

## 2015-09-16 MED ORDER — LOSARTAN POTASSIUM 50 MG PO TABS
50.0000 mg | ORAL_TABLET | Freq: Every day | ORAL | Status: DC
  Administered 2015-09-17 – 2015-09-18 (×2): 50 mg via ORAL
  Filled 2015-09-16 (×2): qty 1

## 2015-09-16 MED ORDER — SODIUM CHLORIDE 0.9% FLUSH
10.0000 mL | INTRAVENOUS | Status: DC | PRN
  Administered 2015-09-17: 10 mL
  Filled 2015-09-16: qty 40

## 2015-09-16 MED ORDER — LIDOCAINE HCL 2 % IJ SOLN
15.0000 mL | Freq: Once | INTRAMUSCULAR | Status: AC
  Administered 2015-09-16: 300 mg
  Filled 2015-09-16: qty 20

## 2015-09-16 MED ORDER — LEVONORGESTREL 20 MCG/24HR IU IUD: 1.0000 | INTRAUTERINE_SYSTEM | Freq: Once | INTRAUTERINE | Status: DC

## 2015-09-16 MED ORDER — GLUCOSAMINE-CHONDROITIN 500-400 MG PO TABS: 1.0000 | ORAL_TABLET | Freq: Every day | ORAL | Status: DC

## 2015-09-16 MED ORDER — PROPOFOL 10 MG/ML IV BOLUS
40.0000 mg | Freq: Once | INTRAVENOUS | Status: AC
  Administered 2015-09-16: 40 mg via INTRAVENOUS
  Filled 2015-09-16: qty 20

## 2015-09-16 MED ORDER — IPRATROPIUM-ALBUTEROL 20-100 MCG/ACT IN AERS: 1.0000 | INHALATION_SPRAY | Freq: Four times a day (QID) | RESPIRATORY_TRACT | Status: DC | PRN

## 2015-09-16 MED ORDER — BUDESONIDE 0.25 MG/2ML IN SUSP
0.2500 mg | Freq: Three times a day (TID) | RESPIRATORY_TRACT | Status: DC
  Administered 2015-09-16 – 2015-09-18 (×5): 0.25 mg via RESPIRATORY_TRACT
  Filled 2015-09-16 (×5): qty 2

## 2015-09-16 MED ORDER — MIDAZOLAM HCL 5 MG/5ML IJ SOLN: 5.0000 mg | Freq: Once | INTRAMUSCULAR | Status: DC

## 2015-09-16 MED ORDER — QUETIAPINE FUMARATE 25 MG PO TABS
25.0000 mg | ORAL_TABLET | Freq: Every day | ORAL | Status: DC
  Administered 2015-09-16 – 2015-09-17 (×2): 25 mg via ORAL
  Filled 2015-09-16 (×2): qty 1

## 2015-09-16 MED ORDER — CYCLOSPORINE 0.05 % OP EMUL
2.0000 [drp] | Freq: Two times a day (BID) | OPHTHALMIC | Status: DC
  Administered 2015-09-16 – 2015-09-18 (×4): 2 [drp] via OPHTHALMIC
  Filled 2015-09-16 (×5): qty 1

## 2015-09-16 MED ORDER — CALCIUM CARBONATE-VITAMIN D 500-200 MG-UNIT PO TABS
1.0000 | ORAL_TABLET | Freq: Two times a day (BID) | ORAL | Status: DC
  Administered 2015-09-17 – 2015-09-18 (×3): 1 via ORAL
  Filled 2015-09-16 (×3): qty 1

## 2015-09-16 MED ORDER — LORATADINE 10 MG PO TABS
10.0000 mg | ORAL_TABLET | Freq: Every day | ORAL | Status: DC
  Administered 2015-09-17 – 2015-09-18 (×2): 10 mg via ORAL
  Filled 2015-09-16 (×2): qty 1

## 2015-09-16 MED ORDER — BECLOMETHASONE DIPROPIONATE 80 MCG/ACT IN AERS: 2.0000 | INHALATION_SPRAY | Freq: Three times a day (TID) | RESPIRATORY_TRACT | Status: DC

## 2015-09-16 MED ORDER — PROBIOTIC FORMULA PO CAPS: 1.0000 | ORAL_CAPSULE | Freq: Every day | ORAL | Status: DC

## 2015-09-16 MED ORDER — ALBUTEROL SULFATE (2.5 MG/3ML) 0.083% IN NEBU: 2.5000 mg | INHALATION_SOLUTION | Freq: Three times a day (TID) | RESPIRATORY_TRACT | Status: DC | PRN

## 2015-09-16 MED ORDER — ADULT MULTIVITAMIN W/MINERALS CH
1.0000 | ORAL_TABLET | Freq: Every day | ORAL | Status: DC
  Administered 2015-09-17 – 2015-09-18 (×2): 1 via ORAL
  Filled 2015-09-16 (×2): qty 1

## 2015-09-16 MED ORDER — UROGESIC-BLUE 81.6 MG PO TABS
1.0000 | ORAL_TABLET | Freq: Two times a day (BID) | ORAL | Status: DC
  Filled 2015-09-16: qty 1

## 2015-09-16 MED ORDER — FAMOTIDINE IN NACL 20-0.9 MG/50ML-% IV SOLN
20.0000 mg | Freq: Two times a day (BID) | INTRAVENOUS | Status: DC
  Administered 2015-09-16 – 2015-09-17 (×2): 20 mg via INTRAVENOUS
  Filled 2015-09-16 (×3): qty 50

## 2015-09-16 MED ORDER — SODIUM CHLORIDE 0.9 % IV SOLN
INTRAVENOUS | Status: DC
  Administered 2015-09-16: 23:00:00 via INTRAVENOUS
  Administered 2015-09-17: 1000 mL via INTRAVENOUS
  Administered 2015-09-18: 04:00:00 via INTRAVENOUS

## 2015-09-16 NOTE — ED Notes (Signed)
Pt on way to room.  MRI stopped pt en route to advise they are ready for her.  Per husband pt is not able to tolerate an MRI at this timed, reports she will not be able to sit still.  This nurse called MRI to advise.  Per MRI have floor call MRI when pt is ready.  Called Laural Benes. Johnson on 5W and advised.

## 2015-09-16 NOTE — ED Notes (Signed)
UA collected in HAT in RR. Pt ambulatory with family assists.

## 2015-09-16 NOTE — ED Notes (Signed)
 4mg  Versed and 120mg  of Propofol wasted in sink/sharps/ and pyxis with Lestine MountJamie RN

## 2015-09-16 NOTE — Progress Notes (Signed)
Report received from Colemanarla, RN in ED, will await for pt. To arrive to 5W 23.  [facility] Cellarelcine Angelys Yetman, RN

## 2015-09-16 NOTE — ED Notes (Signed)
Pt slightly confused. Pt raised concern about her blood pressure cuff being on her left arm because she did not want it to be on her arm with her PICC line (her PICC line is on the opposite arm). Pt also attempted to provide a urine sample but urine missed specimen collector. Pt also left her pants and underwear by her ankles and began to walk back to her room with clothes there. Assisted patient with getting dressed again.

## 2015-09-16 NOTE — H&P (Signed)
History and Physical    Marissa Gibson UJW:[id number] DOB: [birth year] DOA: 09/16/2015  PCP: Thora Lance, MD   Patient coming from/Resides with: Private residence/lives with husband  Chief Complaint: Altered mental status  HPI: Marissa Gibson is a 40 y.o. female with medical history significant for severe persistent allergic asthma, steroid dependent. She is typically followed at [facility] by pulmonology. She also has issues with endometriosis and interstitial cystitis on twice a day bladder irrigations via I/O cath with a solution of Marcaine, bicarbonate and heparin. She's had repeated hospitalizations for asthma exacerbations and most recently has not transitioned well from Solu-Medrol to prednisone prompting placement of a PICC line with initiation of Solu-Medrol 60 mg IV every 12 hours on 5/31. Because of persistent dyspnea on exertion the Solu-Medrol dosage was increased to 60 mg IV every 8 hours on 6/8. The husband noted that over the past couple of days patient seemed to be more lethargic and confused just progressed in the past 24 hours. During her initial evaluation in the ER she was documented as being oriented 3 and not affect and typically answers I don't know too many questions. She was evaluated by neurology. Her Head CT was unremarkable. Neurology was suspicious patient could have steroid-induced mental status changes versus opportunistic infection versus seizure. EEG was recommended and tentative report is abnormal waveforms in the frontal lobe. MRI of the brain was also recommended. LP was done and did not show any evidence of infection or meningitis. Neurology also recommended reducing steroid if clinically allowable. There was a report that patient may have followed him and hit her head in the bathtub 2 days ago but CT of the head was unremarkable.  Upon my evaluation of patient she was quite alert but nonverbal although she would not appropriately. She would look at me when  questions were asked but would not answer. When I asked her if she could talk she nodded no when asked if she could stick out her tongue she noted no. She did open her mouth when asked. Most of the questions when they were directed at the patient the patient would not answer instead look to her husband who within answer the questions. Husband as well as multiple family members in the room or instructed on the plan of care including plans to taper down her steroid which they agreed to. Patient has not had any fevers chills or other constitutional symptoms. During previous hospitalizations she has not had physical therapy or occupational therapy. She has not had a cardiac evaluation in the past regarding her dyspnea.  ED Course:  PO 98.3-BP 151/110-pulse 99-respirations 13-room air saturations 98% Follow-up vital signs: BP 145/94-pulse 89-respirations 16 Two-view chest x-ray: Borderline opacity projected over the heart in the lateral view otherwise no acute abnormalities CT Head without contrast: Normal head CT Lab data: Sodium 137, potassium 3.8, BUN 22, creatinine 0.61, total protein 5.8, troponin less than 0.03, WBC 14,600, McGovern 14.4, platelets 154,000, neutrophils 83%, absolute neutrophils 12.1%, urinalysis unremarkable, LP cytology glucose 70, total protein 50, RBC 16, cryptococcal antigen and antigen titer both negative, urine drug screen negative, I stat hCG less than 5 Diprivan 40 mg IV 1 Versed 4 mg IV 1   Review of Systems:  In addition to the HPI above,  No Fever-chills, myalgias or other constitutional symptoms No Headache, changes with Vision or hearing, new weakness, tingling, numbness in any extremity, No problems swallowing food or Liquids, indigestion/reflux No Chest pain, Cough, palpitations, orthopnea  No Abdominal pain,  N/V; no melena or hematochezia, no dark tarry stools, Bowel movements are regular, No dysuria, hematuria or flank pain No new skin rashes, lesions, masses  or bruises, No new joints pains-aches No recent weight gain or loss No polyuria, polydypsia or polyphagia,   Past Medical History  Diagnosis Date  . Asthma   . Endometriosis   . Ovarian cyst   . Cystitis     Past Surgical History  Procedure Laterality Date  . Cholecystectomy    . Knee surgery    . Appendectomy    . Ovarian cyst removal    . Ablation on endometriosis    . Hernia repair      Social History   Social History  . Marital Status: Married    Spouse Name: N/A  . Number of Children: N/A  . Years of Education: N/A   Occupational History  . Not on file.   Social History Main Topics  . Smoking status: Never Smoker   . Smokeless tobacco: Not on file  . Alcohol Use: Yes  . Drug Use: No  . Sexual Activity: Not on file   Other Topics Concern  . Not on file   Social History Narrative    Mobility: Without assistive devices Work history: [employer]   Allergies  Allergen Reactions  . 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 Other Nausea And Vomiting    anesthesia-severe vomiting with and after sedation Cats, dogs, ragweed, mold, cockroaches, dust, grass pollens, perfumes and polyester-unspecified allergens  . Xolair [Omalizumab] Hives  . Amitriptyline Other (See Comments)    Other reaction(s): GI Upset (intolerance)  . Duloxetine Nausea And Vomiting and Other (See Comments)    Headaches, nausea & vomiting, vertigo  . Ibuprofen Other (See Comments)    Limit ibuprofen type of meds-per MD  . Nsaids Other (See Comments)    Limit use of NSAIDS-per MD  . Lyman Bishop [Pseudoephedrine Hcl] Other (See Comments)    lethargic  . Tape Rash    Steri-strips, bandaids, etc  . Ceclor [Cefaclor] Rash  . Cymbalta [Duloxetine Hcl] Other (See Comments)  . Neomycin-Bacitracin Zn-Polymyx Rash     Family history reviewed and not pertinent To current admission presentation and symptoms  Prior to Admission  medications   Medication Sig Start Date End Date Taking? Authorizing Provider  acetaminophen (TYLENOL) 325 MG tablet Take 650 mg by mouth every 6 (six) hours as needed for mild pain.   Yes Historical Provider, MD  albuterol (PROVENTIL HFA;VENTOLIN HFA) 108 (90 BASE) MCG/ACT inhaler Inhale 1 puff into the lungs every 6 (six) hours as needed for wheezing or shortness of breath.   Yes Historical Provider, MD  albuterol (PROVENTIL) (2.5 MG/3ML) 0.083% nebulizer solution Take 2.5 mg by nebulization every 8 (eight) hours as needed for wheezing or shortness of breath.   Yes Historical Provider, MD  amLODipine (NORVASC) 5 MG tablet Take 5 mg by mouth daily.   Yes Historical Provider, MD  B Complex-C (SUPER B COMPLEX/VITAMIN C PO) Take 1 tablet by mouth daily.   Yes Historical Provider, MD  beclomethasone (QVAR) 80 MCG/ACT inhaler Inhale 2 puffs into the lungs 3 (three) times daily.   Yes Historical Provider, MD  Beclomethasone Dipropionate (QNASL) 80 MCG/ACT AERS Place 1 spray into the nose 2 (two) times daily.    Yes Historical Provider, MD  bupivacaine (MARCAINE) 0.5 % SOLN injection 10 mLs by Infiltration route See admin instructions. 2-3 times per day combined  with Sodium Bicarbonate for bladder instillation   Yes Historical Provider, MD  calcium-vitamin D (OSCAL WITH D) 500-200 MG-UNIT per tablet Take 1 tablet by mouth 2 (two) times daily.    Yes Historical Provider, MD  cholestyramine (QUESTRAN) 4 g packet Take 4 g by mouth 2 (two) times daily.   Yes Historical Provider, MD  CRANBERRY EXTRACT PO Take 1 tablet by mouth daily.    Yes Historical Provider, MD  cycloSPORINE (RESTASIS) 0.05 % ophthalmic emulsion Place 2 drops into both eyes 2 (two) times daily.   Yes Historical Provider, MD  EPINEPHrine (EPIPEN 2-PAK) 0.3 mg/0.3 mL IJ SOAJ injection Inject 0.3 mg into the muscle as needed (for anaphylaxis).   Yes Historical Provider, MD  estradiol (CLIMARA - DOSED IN MG/24 HR) 0.0375 mg/24hr patch Place  0.0375 mg onto the skin every Wednesday.    Yes Historical Provider, MD  FIBER PO Take 2 tablets by mouth daily.   Yes Historical Provider, MD  glucosamine-chondroitin (COSAMIN DS) 500-400 MG tablet Take 1 tablet by mouth daily.   Yes Historical Provider, MD  guaiFENesin (MUCINEX) 600 MG 12 hr tablet Take 600 mg by mouth 2 (two) times daily as needed for cough.   Yes Historical Provider, MD  HYDROcodone-acetaminophen (NORCO/VICODIN) 5-325 MG tablet Take 1 tablet by mouth every 6 (six) hours as needed for moderate pain.   Yes Historical Provider, MD  ipratropium (ATROVENT) 0.06 % nasal spray Place 1 spray into both nostrils 2 (two) times daily.    Yes Historical Provider, MD  Ipratropium-Albuterol (COMBIVENT RESPIMAT) 20-100 MCG/ACT AERS respimat Inhale 1 puff into the lungs every 6 (six) hours as needed for wheezing or shortness of breath.   Yes Historical Provider, MD  levocetirizine (XYZAL) 5 MG tablet Take 5 mg by mouth every morning.    Yes Historical Provider, MD  levonorgestrel (MIRENA) 20 MCG/24HR IUD 1 each by Intrauterine route once.   Yes Historical Provider, MD  losartan (COZAAR) 50 MG tablet Take 50 mg by mouth daily.   Yes Historical Provider, MD  magnesium oxide (MAG-OX) 400 MG tablet Take 400 mg by mouth daily.   Yes Historical Provider, MD  Methen-Hyosc-Meth Blue-Na Phos (UROGESIC-BLUE) 81.6 MG TABS Take 1 tablet by mouth See admin instructions. Takes 2-3 times daily   Yes Historical Provider, MD  Multiple Vitamin (MULTIVITAMIN WITH MINERALS) TABS tablet Take 1 tablet by mouth daily.   Yes Historical Provider, MD  pentosan polysulfate (ELMIRON) 100 MG capsule Take 100 mg by mouth 3 (three) times daily.   Yes Historical Provider, MD  Probiotic Product (PROBIOTIC FORMULA) CAPS Take 1 capsule by mouth daily.   Yes Historical Provider, MD  terconazole (TERAZOL 7) 0.4 % vaginal cream Place 1 applicator vaginally at bedtime. Started 4/23 for 7 days ending 4/30   Yes Historical Provider, MD    Tiotropium Bromide-Olodaterol (STIOLTO RESPIMAT) 2.5-2.5 MCG/ACT AERS Inhale 1 puff into the lungs 2 (two) times daily.   Yes Historical Provider, MD  zileuton (ZYFLO CR) 600 MG CR tablet Take 1,200 mg by mouth 2 (two) times daily.   Yes Historical Provider, MD    Physical Exam: Filed Vitals:   09/16/15 1355 09/16/15 1400 09/16/15 1415 09/16/15 1430  BP: 134/93 144/93 137/94 145/94  Pulse: 84 89 82 88  Temp:      TempSrc:      Resp: 13 14 13 16   SpO2: 98% 99% 98% 97%      Constitutional: In no distress Eyes: PERRL, lids and conjunctivae normal  ENMT: Mucous membranes are moist. Posterior pharynx clear of any exudate or lesions.Normal dentition. Cushingoid appearance to face Neck: normal, supple, no masses, no thyromegaly Respiratory: clear to auscultation bilaterally, no wheezing, no crackles. Normal respiratory effort. No accessory muscle use. Room air Cardiovascular: Regular rate and rhythm, no murmurs / rubs / gallops. No extremity edema. 2+ pedal pulses. No carotid bruits.  Abdomen: no tenderness, no masses palpated. No hepatosplenomegaly. Bowel sounds positive.  Musculoskeletal: no clubbing / cyanosis. No joint deformity upper and lower extremities. Good ROM, no contractures. Proximal muscle tone appears normal with distal extremities having a wasted appearance Skin: no rashes, lesions, ulcers. No induration-very flushed upper chest and neck Neurologic: CN 2-12 grossly intact. Sensation intact, DTR normal. Strength 4-5/5 x all 4 extremities.  Psychiatric: Very flat affect, intermittently nonverbal when approached for questions. Because of limited verbal responses unable to obtain accurate psych history   Labs on Admission: I have personally reviewed following labs and imaging studies  CBC:  Recent Labs Lab 09/16/15 0844  WBC 14.6*  NEUTROABS 12.1*  HGB 14.4  HCT 43.4  MCV 98.2  PLT 154   Basic Metabolic Panel:  Recent Labs Lab 09/16/15 0844  NA 137  K 3.8  CL  105  CO2 23  GLUCOSE 98  BUN 22*  CREATININE 0.61  CALCIUM 9.4  MG 2.3   GFR: CrCl cannot be calculated (Unknown ideal weight.). Liver Function Tests:  Recent Labs Lab 09/16/15 0844  AST 19  ALT 34  ALKPHOS 51  BILITOT 1.0  PROT 5.8*  ALBUMIN 3.5   No results for input(s): LIPASE, AMYLASE in the last 168 hours. No results for input(s): AMMONIA in the last 168 hours. Coagulation Profile: No results for input(s): INR, PROTIME in the last 168 hours. Cardiac Enzymes:  Recent Labs Lab 09/16/15 0844  TROPONINI <0.03   BNP (last 3 results) No results for input(s): PROBNP in the last 8760 hours. HbA1C: No results for input(s): HGBA1C in the last 72 hours. CBG: No results for input(s): GLUCAP in the last 168 hours. Lipid Profile: No results for input(s): CHOL, HDL, LDLCALC, TRIG, CHOLHDL, LDLDIRECT in the last 72 hours. Thyroid Function Tests: No results for input(s): TSH, T4TOTAL, FREET4, T3FREE, THYROIDAB in the last 72 hours. Anemia Panel: No results for input(s): VITAMINB12, FOLATE, FERRITIN, TIBC, IRON, RETICCTPCT in the last 72 hours. Urine analysis:    Component Value Date/Time   COLORURINE YELLOW 09/16/2015 0950   APPEARANCEUR CLEAR 09/16/2015 0950   LABSPEC 1.006 09/16/2015 0950   PHURINE 7.0 09/16/2015 0950   GLUCOSEU NEGATIVE 09/16/2015 0950   HGBUR TRACE* 09/16/2015 0950   BILIRUBINUR NEGATIVE 09/16/2015 0950   KETONESUR NEGATIVE 09/16/2015 0950   PROTEINUR NEGATIVE 09/16/2015 0950   UROBILINOGEN 0.2 11/08/2014 0551   NITRITE NEGATIVE 09/16/2015 0950   LEUKOCYTESUR NEGATIVE 09/16/2015 0950   Sepsis Labs: @LABRCNTIP (procalcitonin:4,lacticidven:4) )No results found for this or any previous visit (from the past 240 hour(s)).   Radiological Exams on Admission: Dg Chest 2 View  09/16/2015  CLINICAL DATA:  Altered mental status EXAM: CHEST  2 VIEW COMPARISON:  July 31, 2015 FINDINGS: A right PICC line has been placed in the interval and is in good  position. The frontal view is otherwise normal. Mild increased opacity projected over the heart on the lateral view, new in the interval. No other acute abnormalities. IMPRESSION: Mild opacity projected over the heart on the lateral view. Recommend follow-up to resolution. The right PICC line is in good position. Electronically Signed  By: Gerome Sam III M.D   On: 09/16/2015 09:08   Ct Head Wo Contrast  09/16/2015  CLINICAL DATA:  Initial encounter for PT. FELL Thursday, TODAY EXPERIENCING AMS, FAMILY MEMBERS EXPLAINS SHE IS NOT ACTING HERSELF, CONFUSION, PT. WITH PICC FOR ASTHMA, PT. SAYS SHE DOES KNOW IF SHE HIT HER HEAD OR NOT, DENIES PAIN-HEMATOMA, LW EXAM: CT HEAD WITHOUT CONTRAST TECHNIQUE: Contiguous axial images were obtained from the base of the skull through the vertex without intravenous contrast. COMPARISON:  05/30/2008 FINDINGS: Sinuses/Soft tissues: Clear paranasal sinuses and mastoid air cells. Intracranial: No mass lesion, hemorrhage, hydrocephalus, acute infarct, intra-axial, or extra-axial fluid collection. IMPRESSION: Normal head CT. Electronically Signed   By: Jeronimo Greaves M.D.   On: 09/16/2015 10:50    EKG: (Independently reviewed) sinus rhythm with ventricular rate 83 bpm, QTC 406 ms, normal R with progression and no ST segment or T-wave changes up and become concerned for ischemia  Assessment/Plan Principal Problem:   Acute encephalopathy -Patient presents with encephalopathy that began after initiation of relatively high dose IV steroids in the home environment -Differential includes: Steroid induced mental status changes/psychoses, opportunistic infection in setting of recurrent use of steroids, seizure activity and less likely stroke -Appreciate neurology assistance -Tentative report on EEG concerning for possible waveform abnormalities in the frontal lobe -Check MRI per recommendation neuro -Lumbar puncture not consistent with meningitis -Check blood cultures, lactic  acid and Procalcitonin although no medical signs of infection present  Active Problems:   Severe persistent asthma/Steroid-dependent asthma -Followed by pulmonology at [facility] resolution CT scan of the chest obtained 4/25 revealed extensive atelectasis and or post infectious/inflammatory scarring throughout the lung bases bilaterally predominantly in the lower lobes but no typical stigmata that would be suggestive of interstitial lung disease -ABG here unremarkable -A1 antitrypsin serologies negative -IgE serology negative/within normal limits -PFTs performed in April of this year within normal limits -Pulmonology consulted here -Per recommendation from neurology had decreased Solu-Medrol to 60 mg IV every 12 hours given concerns of steroid induced mental status changes -Patient on multiple MDIs and nebulizers at home and these have been reordered -She has never been on PPI to cover for possible reflux/vocal cord dysfunction-begin Pepcid this admission -Because of persistent dyspnea on exertion for completeness of exam check BNP and echocardiogram to rule out cardiac etiology    Leukocytosis -Likely related to steroid use but as precaution and infectious workup has been initiated -Since no clearly identifiable source of infection and no constitutional symptoms and patient otherwise hemodynamically stable no indication to initiate empiric antibiotics at this juncture    Essential hypertension -Continue preadmission Cozaar and Norvasc    Interstitial cystitis -I have asked pharmacy to clarify solution for twice a day bladder irrigations    Physical deconditioning -PT/ OT evaluation this admission -Concern with frequent use of steroids patient may be developing steroid-induced myopathy    Oral thrush --Reports that patient has "really bad thrush" but tongue appears normal but unable to visualize posterior pharynx       DVT prophylaxis: Lovenox Code Status: Full code Family  Communication: Husband and other multiple family members at bedside Disposition Plan: Anticipate discharge back to preadmission home environment once medically stable and pending PT/OT evaluation Consults called: Neurology/Kirkpatrick; pulmonary medicine-on-call pulmonologist with likely nonurgent consultation on 6/11 Admission status: Observation/medical surgical floor     Ephrata Verville L. ANP-BC [facility] Pager [phone]   If 7PM-7AM, please contact night-coverage [email or web address] Password [email or web address]  09/16/2015, 4:15 PM

## 2015-09-16 NOTE — ED Notes (Signed)
Patient transported to X-ray 

## 2015-09-16 NOTE — ED Notes (Signed)
Pt. Presents with concern over altered mental status. Pt. Has PICC line for IV steroids to help control asthma. Pt. Husband states she received IV magnesium by home health nurse on Thursday afternoon and has been "In and out" since then. States pt. Will intermittently state "I'm back." Pt. Alert and oriented on assessment today.

## 2015-09-16 NOTE — Progress Notes (Signed)
EEG Completed; Results Pending  

## 2015-09-16 NOTE — Consult Note (Addendum)
Neurology Consultation Reason for Consult: Altered mental status Referring Physician: Delo, D  CC: Altered mental status  History is obtained from: Family  HPI: Marissa Gibson is a 40 y.o. female with a history of asthma who has been having a difficult time with her asthma over the past few weeks. Due to this, in late May she was started on twice a day Solu-Medrol through a PICC. She has been getting that since that time. 2 days ago, the husband noticed that she was slightly more confused. Yesterday, however he got significantly worse with her having repetitive speech, not noise making sense. Due to her continuing to have these symptoms he brought her into the emergency department. He states that it does seem to wax and wane with improvement in worsening over the course of the day.    ROS: A 14 point ROS was performed and is negative except as noted in the HPI.   Past Medical History  Diagnosis Date  . Asthma   . Endometriosis   . Ovarian cyst   . Cystitis     Family history: No history of similar   Social History:  reports that she has never smoked. She does not have any smokeless tobacco history on file. She reports that she drinks alcohol. She reports that she does not use illicit drugs.   Exam: Current vital signs: BP 134/96 mmHg  Pulse 88  Temp(Src) 98.3 F (36.8 C) (Oral)  Resp 15  SpO2 97% Vital signs in last 24 hours: Temp:  [98.3 F (36.8 C)] 98.3 F (36.8 C) (06/10 0835) Pulse Rate:  [80-107] 88 (06/10 1300) Resp:  [10-19] 15 (06/10 1300) BP: (127-151)/(92-110) 134/96 mmHg (06/10 1300) SpO2:  [97 %-99 %] 97 % (06/10 1300)   Physical Exam  Constitutional: Appears well-developed and well-nourished.  Psych: Affect appropriate to situation Eyes: No scleral injection HENT: No OP obstrucion Head: Normocephalic.  Cardiovascular: Normal rate and regular rhythm.  Respiratory: Effort normal and breath sounds normal to anterior ascultation GI: Soft.  No distension.  There is no tenderness.  Skin: WDI  Neuro: Mental Status: Patient is awake, alert, She will not give me a gas in the month or year. She has repetitive speech saying "I don't know" frequently. She has some perseveration as well. She does follow commands and is able to identify both her husband and her father in the room. Cranial Nerves: II: She endorses seeing finger movement bilaterally, but perseverates on 2 when asking her to count fingers Pupils are , round, and reactive to light.  Slight asymmetry to the pupils. III,IV, VI: EOMI without ptosis or diploplia.  V: Facial sensation is symmetric to temperature VII: Facial movement is symmetric.  VIII: hearing is intact to voice X: Uvula elevates symmetrically XI: Shoulder shrug is symmetric. XII: tongue is midline without atrophy or fasciculations.  Motor: Tone is normal. Bulk is normal. 5/5 strength was present in all four extremities.  Sensory: Sensation is symmetric to light touch and temperature in the arms and legs. Cerebellar: No clear dysmetria    I have reviewed labs in epic and the results pertinent to this consultation are: CMP-unremarkable   I have reviewed the images obtained: CT head-unremarkable  Impression: 40 year old female with altered mental status for 3 days in the setting of relatively high steroid use. Possibilities include steroid-induced mental status changes, opportunistic infection, seizure, less likely to be stroke.   Recommendations: 1) EEG 2) MRI brain 3) lumbar puncture for cells, protein, glucose, crypto, culture, HSV  by PCR 4) I would consider reducing steroid if clinically allowable  5) neurology will follow   Ritta SlotMcNeill Kenith Trickel, MD [facility] [phone]  If 7pm- 7am, please page neurology on call as listed in AMION.

## 2015-09-16 NOTE — ED Notes (Signed)
Patient transported to CT 

## 2015-09-16 NOTE — ED Notes (Signed)
Paged Dr. Benjamine MolaVann, advised MD that pt is unable to tolerate MRI at this time.  MD does not want to give any sedation orders at this time, d/t being sedated earlier in ED.  Advised MD of lactic acid results, 2.3.  No orders.  Magda Bernheimalled D. Johnson RN at [facility] and advised.

## 2015-09-16 NOTE — ED Provider Notes (Signed)
CSN: [id number]     Arrival date & time 09/16/15  4098 History   First MD Initiated Contact with Patient 09/16/15 [id number]     Chief Complaint  Patient presents with  . Altered Mental Status    HPI   Marissa Gibson is an 40 y.o. female with history of severe persistent asthma and endometriosis who presents to the ED for evaluation of altered mental status. Her history is provided in part by her husband. Her husband reports that pt received a dose of IV magnesium on 6/8 via home health. He states that over that evening she seemed a little "off" but the next morning she woke up acting markedly different than baseline. Pt's husband states she will come "in and out" and will say things like "I'm here" or "I'm back." she has otherwise had no other abnormal behaviors. No past psych history. No new meds or exposures. No fever, chills, URI symptoms. In the ED pt is oriented x 3 and acting appropriately though has a slightly odd affect. She has no complaints. She states she thinks she feels okay and then other times says "I don't know" when asking how she feels. She denies pain, n/v/d. She denies SI/HI/VH/AH.  Past Medical History  Diagnosis Date  . Asthma   . Endometriosis   . Ovarian cyst   . Cystitis    Past Surgical History  Procedure Laterality Date  . Cholecystectomy    . Knee surgery    . Appendectomy    . Ovarian cyst removal    . Ablation on endometriosis    . Hernia repair     No family history on file. Social History  Substance Use Topics  . Smoking status: Never Smoker   . Smokeless tobacco: Not on file  . Alcohol Use: Yes   OB History    No data available     Review of Systems  All other systems reviewed and are negative.     Allergies  Allspice; Other; Xolair; Amitriptyline; Duloxetine; Ibuprofen; Nsaids; Sudafed; Tape; Ceclor; Cymbalta; and Neomycin-bacitracin zn-polymyx  Home Medications   Prior to Admission medications   Medication Sig Start Date End Date Taking?  Authorizing Provider  acetaminophen (TYLENOL) 325 MG tablet Take 650 mg by mouth every 6 (six) hours as needed for mild pain.    Historical Provider, MD  albuterol (PROVENTIL HFA;VENTOLIN HFA) 108 (90 BASE) MCG/ACT inhaler Inhale 1 puff into the lungs every 6 (six) hours as needed for wheezing or shortness of breath.    Historical Provider, MD  albuterol (PROVENTIL) (2.5 MG/3ML) 0.083% nebulizer solution Take 2.5 mg by nebulization every 8 (eight) hours as needed for wheezing or shortness of breath.    Historical Provider, MD  amLODipine (NORVASC) 5 MG tablet Take 5 mg by mouth daily.    Historical Provider, MD  B Complex-C (SUPER B COMPLEX/VITAMIN C PO) Take 1 tablet by mouth daily.    Historical Provider, MD  beclomethasone (QVAR) 80 MCG/ACT inhaler Inhale 2 puffs into the lungs 3 (three) times daily.    Historical Provider, MD  Beclomethasone Dipropionate (QNASL) 80 MCG/ACT AERS Place 1 spray into the nose 2 (two) times daily.     Historical Provider, MD  bupivacaine (MARCAINE) 0.5 % SOLN injection 10 mLs by Infiltration route See admin instructions. 2-3 times per day combined with Sodium Bicarbonate for bladder instillation    Historical Provider, MD  calcium-vitamin D (OSCAL WITH D) 500-200 MG-UNIT per tablet Take 1 tablet by mouth daily with breakfast.  Historical Provider, MD  cholestyramine (QUESTRAN) 4 g packet Take 4 g by mouth 2 (two) times daily.    Historical Provider, MD  CRANBERRY EXTRACT PO Take 1 tablet by mouth daily.     Historical Provider, MD  cycloSPORINE (RESTASIS) 0.05 % ophthalmic emulsion Place 2 drops into both eyes 2 (two) times daily.    Historical Provider, MD  EPINEPHrine (EPIPEN 2-PAK) 0.3 mg/0.3 mL IJ SOAJ injection Inject 0.3 mg into the muscle as needed (for anaphylaxis).    Historical Provider, MD  estradiol (CLIMARA - DOSED IN MG/24 HR) 0.0375 mg/24hr patch Place 0.0375 mg onto the skin every Sunday.     Historical Provider, MD  FIBER PO Take 2 tablets by mouth  daily.    Historical Provider, MD  glucosamine-chondroitin (COSAMIN DS) 500-400 MG tablet Take 1 tablet by mouth daily.    Historical Provider, MD  guaiFENesin (MUCINEX) 600 MG 12 hr tablet Take 600 mg by mouth 2 (two) times daily as needed for cough.    Historical Provider, MD  HYDROcodone-acetaminophen (NORCO/VICODIN) 5-325 MG tablet Take 1 tablet by mouth every 6 (six) hours as needed for moderate pain.    Historical Provider, MD  ipratropium (ATROVENT) 0.06 % nasal spray Place 1 spray into both nostrils 2 (two) times daily.     Historical Provider, MD  Ipratropium-Albuterol (COMBIVENT RESPIMAT) 20-100 MCG/ACT AERS respimat Inhale 1 puff into the lungs every 6 (six) hours as needed for wheezing or shortness of breath.    Historical Provider, MD  levocetirizine (XYZAL) 5 MG tablet Take 5 mg by mouth every morning.     Historical Provider, MD  levonorgestrel (MIRENA) 20 MCG/24HR IUD 1 each by Intrauterine route once.    Historical Provider, MD  losartan (COZAAR) 50 MG tablet Take 50 mg by mouth daily.    Historical Provider, MD  magnesium oxide (MAG-OX) 400 MG tablet Take 400 mg by mouth daily.    Historical Provider, MD  Methen-Hyosc-Meth Blue-Na Phos (UROGESIC-BLUE) 81.6 MG TABS Take 1 tablet by mouth See admin instructions. Takes 2-3 times daily    Historical Provider, MD  Multiple Vitamin (MULTIVITAMIN WITH MINERALS) TABS tablet Take 1 tablet by mouth daily.    Historical Provider, MD  pentosan polysulfate (ELMIRON) 100 MG capsule Take 100 mg by mouth 3 (three) times daily.    Historical Provider, MD  predniSONE (DELTASONE) 20 MG tablet Take 2 tablets (40 mg total) by mouth daily with breakfast. 08/03/15   Kathlen ModyVijaya Akula, MD  Probiotic Product (PROBIOTIC FORMULA) CAPS Take 1 capsule by mouth daily.    Historical Provider, MD  terconazole (TERAZOL 7) 0.4 % vaginal cream Place 1 applicator vaginally at bedtime. Started 4/23 for 7 days ending 4/30    Historical Provider, MD  Tiotropium  Bromide-Olodaterol (STIOLTO RESPIMAT) 2.5-2.5 MCG/ACT AERS Inhale 1 puff into the lungs 2 (two) times daily.    Historical Provider, MD  zileuton (ZYFLO CR) 600 MG CR tablet Take 1,200 mg by mouth 2 (two) times daily.    Historical Provider, MD   BP 151/110 mmHg  Pulse 95  Temp(Src) 98.3 F (36.8 C) (Oral)  Resp 13  SpO2 98% Physical Exam  Constitutional: She is oriented to person, place, and time.  HENT:  Right Ear: External ear normal.  Left Ear: External ear normal.  Nose: Nose normal.  Mouth/Throat: No oropharyngeal exudate.  Oral thrush   Eyes: Conjunctivae and EOM are normal. Pupils are equal, round, and reactive to light.  Neck: Normal range of motion.  Neck supple.  Cardiovascular: Normal rate, regular rhythm, normal heart sounds and intact distal pulses.   Pulmonary/Chest: Effort normal and breath sounds normal. No respiratory distress. She has no wheezes. She has no rales.  Abdominal: Soft. Bowel sounds are normal. She exhibits no distension. There is no tenderness. There is no rebound and no guarding.  Musculoskeletal: She exhibits no edema.  Neurological: She is alert and oriented to person, place, and time. No cranial nerve deficit.  Normal finger to nose No pronator drift Moves all extremities freely Ambulatory with steady gait  Skin: Skin is warm and dry.  Psychiatric: She has a normal mood and affect.  Odd, flat affect  Nursing note and vitals reviewed.  Filed Vitals:   09/16/15 0835 09/16/15 1000  BP: 151/110 145/98  Pulse: 95 83  Temp: 98.3 F (36.8 C)   TempSrc: Oral   Resp: 13 13  SpO2: 98% 97%     ED Course  Procedures (including critical care time) Labs Review Labs Reviewed  COMPREHENSIVE METABOLIC PANEL - Abnormal; Notable for the following:    BUN 22 (*)    Total Protein 5.8 (*)    All other components within normal limits  CBC WITH DIFFERENTIAL/PLATELET - Abnormal; Notable for the following:    WBC 14.6 (*)    Neutro Abs 12.1 (*)     Monocytes Absolute 1.2 (*)    All other components within normal limits  URINALYSIS, ROUTINE W REFLEX MICROSCOPIC (NOT AT [facility]) - Abnormal; Notable for the following:    Hgb urine dipstick TRACE (*)    All other components within normal limits  URINE MICROSCOPIC-ADD ON - Abnormal; Notable for the following:    Squamous Epithelial / LPF 0-5 (*)    Bacteria, UA RARE (*)    All other components within normal limits  PROTEIN AND GLUCOSE, CSF - Abnormal; Notable for the following:    Total  Protein, CSF 50 (*)    All other components within normal limits  CSF CELL COUNT WITH DIFFERENTIAL - Abnormal; Notable for the following:    RBC Count, CSF 16 (*)    All other components within normal limits  CULTURE, BLOOD (ROUTINE X 2)  CULTURE, BLOOD (ROUTINE X 2)  CSF CULTURE  CULTURE, BLOOD (ROUTINE X 2)  CULTURE, BLOOD (ROUTINE X 2)  TROPONIN I  MAGNESIUM  URINE RAPID DRUG SCREEN, HOSP PERFORMED  CRYPTOCOCCAL ANTIGEN, CSF  ETHANOL  ACETAMINOPHEN LEVEL  SALICYLATE LEVEL  HERPES SIMPLEX VIRUS(HSV) DNA BY PCR  MISC [facility] TEST (SEND OUT)  PROCALCITONIN  BLOOD GAS, ARTERIAL  LACTIC ACID, PLASMA  BRAIN NATRIURETIC PEPTIDE  I-STAT BETA HCG BLOOD, ED ([facility], [facility], [facility] ONLY)    Imaging Review Dg Chest 2 View  09/16/2015  CLINICAL DATA:  Altered mental status EXAM: CHEST  2 VIEW COMPARISON:  July 31, 2015 FINDINGS: A right PICC line has been placed in the interval and is in good position. The frontal view is otherwise normal. Mild increased opacity projected over the heart on the lateral view, new in the interval. No other acute abnormalities. IMPRESSION: Mild opacity projected over the heart on the lateral view. Recommend follow-up to resolution. The right PICC line is in good position. Electronically Signed   By: Gerome  III M.D   On: 09/16/2015 09:08   Ct Head Wo Contrast  09/16/2015  CLINICAL DATA:  Initial encounter for PT. FELL Thursday, TODAY EXPERIENCING AMS, FAMILY MEMBERS EXPLAINS  SHE IS NOT ACTING HERSELF, CONFUSION, PT. WITH PICC FOR  ASTHMA, PT. SAYS SHE DOES KNOW IF SHE HIT HER HEAD OR NOT, DENIES PAIN-HEMATOMA, LW EXAM: CT HEAD WITHOUT CONTRAST TECHNIQUE: Contiguous axial images were obtained from the base of the skull through the vertex without intravenous contrast. COMPARISON:  05/30/2008 FINDINGS: Sinuses/Soft tissues: Clear paranasal sinuses and mastoid air cells. Intracranial: No mass lesion, hemorrhage, hydrocephalus, acute infarct, intra-axial, or extra-axial fluid collection. IMPRESSION: Normal head CT. Electronically Signed   By: Jeronimo Greaves M.D.   On: 09/16/2015 10:50   I have personally reviewed and evaluated these images and lab results as part of my medical decision-making.   EKG Interpretation   Date/Time:  Saturday September 16 2015 09:07:01 EDT Ventricular Rate:  83 PR Interval:  125 QRS Duration: 94 QT Interval:  346 QTC Calculation: 406 R Axis:   73 Text Interpretation:  Sinus rhythm Normal ECG Confirmed by DELO  MD,  DOUGLAS ([id number]) on 09/16/2015 9:20:58 AM      MDM   Final diagnoses:  Altered mental status, unspecified altered mental status type    I was notified by nursing staff that pt attempted to provide urine sample and unfortunately missed the cup, and then came out of the restroom with her underwear and pants down at her ankles. Her level of orientation is also fluctuating as earlier she was A&O x3 with me but on Dr. Sandi Carne assessment she could not say the year, month, or day. Her labs thus far are unrevealing. Urine is pending. CXR shows a possible opacity but pt denies URI symptoms and I doubt this is causing her mental status change today.  Pt's father and husband called me into the room prior to me speaking with neuro. Pt apparently told them that she fell and hit her head in the bathtub two days ago. Will obtain CT head.   CT head negative. Pt with persistent intermittent confusion/delirium. I spoke with Dr. Amada Jupiter of neurology  who will come evaluate pt in the ED.  Dr. Amada Jupiter saw pt in the ED. Unclear etiology at this point though definitely concerning. He is ordering EEG in the ED now. We will plan to LP in the ED to r/o meningitis. Plan for medical admission.  LP performed by Dr. Judd Lien under conscious sedation. Please see his documentation for further details. CSF analysis pending. After re-discussion with Dr. Amada Jupiter he recommends an admission to medicine and he will be involved with pt's care as well. He recommends cutting back on pt's IV steroids--pulmonology involvement might be beneficial.   CSF reveals protein 50, RBC 16. I spoke with Junious Silk, NP of the hospitalist team who will admit pt to the service. We will hold off on initiating any kind of antibiotic therapy at this time.   Carlene Coria, PA-C 09/16/15 1512  Edit to add: Pt also recently increased solu-medrol at home from  BID to TID. ?steroid toxicity?  Carlene Coria, PA-C 09/16/15 1512  Geoffery Lyons, MD 09/17/15 1914  Geoffery Lyons, MD 09/21/15 [phone]

## 2015-09-16 NOTE — Progress Notes (Signed)
Marissa Gibson Matthew [id number] Admitted to 5W 23: 09/16/2015 6:08 PM Attending Provider: Joseph ArtJessica U Vann, DO    Marissa Gibson is a 40 y.o. female patient admitted from ED awake, alert  & orientated  X 1, non-verbal most of the time, will look to husband for any questions that is asked of her  Full Code, VSS - Blood pressure 140/91, pulse 107, temperature 99.2 F (37.3 C), temperature source Oral, resp. rate 15, SpO2 95 %., R/A, no c/o shortness of breath, no c/o chest pain, no distress noted. Non- Tele placed.   IV site WDL:  antecubital right single lumen P[facility], IV team paged to check condition no redness with a transparent dsg that's clean dry and intact.  Allergies:   Allergies  Allergen Reactions  . 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 Other Nausea And Vomiting    anesthesia-severe vomiting with and after sedation Cats, dogs, ragweed, mold, cockroaches, dust, grass pollens, perfumes and polyester-unspecified allergens  . Xolair [Omalizumab] Hives  . Amitriptyline Other (See Comments)    Other reaction(s): GI Upset (intolerance)  . Duloxetine Nausea And Vomiting and Other (See Comments)    Headaches, nausea & vomiting, vertigo  . Ibuprofen Other (See Comments)    Limit ibuprofen type of meds-per MD  . Nsaids Other (See Comments)    Limit use of NSAIDS-per MD  . Lyman BishopSudafed [Pseudoephedrine Hcl] Other (See Comments)    lethargic  . Tape Rash    Steri-strips, bandaids, etc  . Ceclor [Cefaclor] Rash  . Cymbalta [Duloxetine Hcl] Other (See Comments)  . Neomycin-Bacitracin Zn-Polymyx Rash     Past Medical History  Diagnosis Date  . Asthma   . Endometriosis   . Ovarian cyst   . Cystitis     History:  obtained from unobtainable from patient due to lack of cooperation & mental status.  Pt. And family orientated to unit, room and routine. SR up x 2, fall risk assessment complete with  Patient and family verbalizing understanding of risks associated with falls. Family verbalizes an understanding of how to use the call bell and to call for help before getting out of bed.  Skin, clean-dry- intact without evidence of bruising, or skin tears.   No evidence of skin break down noted on exam.    Will cont to monitor and assist as needed.  Joana ReamerJohnson, Jamaree Hosier C, RN 09/16/2015 6:08 PM

## 2015-09-16 NOTE — ED Notes (Signed)
Pt returned to ED from CT

## 2015-09-16 NOTE — Procedures (Signed)
History: 40 year old female with altered mental status  Sedation: None  Technique: This is a 21 channel routine scalp EEG performed at the bedside with bipolar and monopolar montages arranged in accordance to the international 10/20 system of electrode placement. One channel was dedicated to EKG recording.    Background: There is a posterior dominant rhythm of 10 Hz which is well-organized and attenuates with eye opening. In addition there is diffuse, but frontally predominant 3- 4 Hz delta activity which though quasi-rhythmic at times, is predominantly irregular with no evolution.   Photic stimulation: Physiologic driving is not performed  EEG Abnormalities: 1) frontally predominant generalized irregular delta activity  Clinical Interpretation: This EEG is consistent with a generalized non-specific cerebral dysfunction(encephalopathy), though there is some suggestion that this may be a bifrontally predominant dysfunction.   There was no seizure or seizure predisposition recorded on this study. Please note that a normal EEG does not preclude the possibility of epilepsy.   Ritta SlotMcNeill Jacquelinne Speak, MD [facility] [phone]  If 7pm- 7am, please page neurology on call as listed in AMION.

## 2015-09-17 ENCOUNTER — Observation Stay (HOSPITAL_BASED_OUTPATIENT_CLINIC_OR_DEPARTMENT_OTHER): Payer: BLUE CROSS/BLUE SHIELD

## 2015-09-17 DIAGNOSIS — R0600 Dyspnea, unspecified: Secondary | ICD-10-CM

## 2015-09-17 DIAGNOSIS — Z79899 Other long term (current) drug therapy: Secondary | ICD-10-CM | POA: Diagnosis not present

## 2015-09-17 DIAGNOSIS — Z7952 Long term (current) use of systemic steroids: Secondary | ICD-10-CM | POA: Diagnosis not present

## 2015-09-17 DIAGNOSIS — G9340 Encephalopathy, unspecified: Secondary | ICD-10-CM | POA: Diagnosis present

## 2015-09-17 DIAGNOSIS — T380X5A Adverse effect of glucocorticoids and synthetic analogues, initial encounter: Secondary | ICD-10-CM | POA: Diagnosis present

## 2015-09-17 DIAGNOSIS — Z886 Allergy status to analgesic agent status: Secondary | ICD-10-CM | POA: Diagnosis not present

## 2015-09-17 DIAGNOSIS — Z91018 Allergy to other foods: Secondary | ICD-10-CM | POA: Diagnosis not present

## 2015-09-17 DIAGNOSIS — I10 Essential (primary) hypertension: Secondary | ICD-10-CM | POA: Diagnosis not present

## 2015-09-17 DIAGNOSIS — N3010 Interstitial cystitis (chronic) without hematuria: Secondary | ICD-10-CM | POA: Diagnosis present

## 2015-09-17 DIAGNOSIS — Z881 Allergy status to other antibiotic agents status: Secondary | ICD-10-CM | POA: Diagnosis not present

## 2015-09-17 DIAGNOSIS — R4182 Altered mental status, unspecified: Secondary | ICD-10-CM | POA: Diagnosis not present

## 2015-09-17 DIAGNOSIS — Z888 Allergy status to other drugs, medicaments and biological substances status: Secondary | ICD-10-CM | POA: Diagnosis not present

## 2015-09-17 DIAGNOSIS — B370 Candidal stomatitis: Secondary | ICD-10-CM | POA: Diagnosis present

## 2015-09-17 DIAGNOSIS — R400 Somnolence: Secondary | ICD-10-CM | POA: Diagnosis not present

## 2015-09-17 DIAGNOSIS — Z7951 Long term (current) use of inhaled steroids: Secondary | ICD-10-CM | POA: Diagnosis not present

## 2015-09-17 DIAGNOSIS — J4550 Severe persistent asthma, uncomplicated: Secondary | ICD-10-CM | POA: Diagnosis present

## 2015-09-17 DIAGNOSIS — E876 Hypokalemia: Secondary | ICD-10-CM | POA: Diagnosis present

## 2015-09-17 DIAGNOSIS — N809 Endometriosis, unspecified: Secondary | ICD-10-CM | POA: Diagnosis present

## 2015-09-17 DIAGNOSIS — G92 Toxic encephalopathy: Secondary | ICD-10-CM | POA: Diagnosis present

## 2015-09-17 LAB — COMPREHENSIVE METABOLIC PANEL
ALK PHOS: 50 U/L (ref 38–126)
ALT: 32 U/L (ref 14–54)
ANION GAP: 8 (ref 5–15)
AST: 17 U/L (ref 15–41)
Albumin: 3 g/dL — ABNORMAL LOW (ref 3.5–5.0)
BILIRUBIN TOTAL: 0.9 mg/dL (ref 0.3–1.2)
BUN: 14 mg/dL (ref 6–20)
CALCIUM: 8.7 mg/dL — AB (ref 8.9–10.3)
CO2: 25 mmol/L (ref 22–32)
Chloride: 104 mmol/L (ref 101–111)
Creatinine, Ser: 0.46 mg/dL (ref 0.44–1.00)
GLUCOSE: 101 mg/dL — AB (ref 65–99)
Potassium: 3.1 mmol/L — ABNORMAL LOW (ref 3.5–5.1)
Sodium: 137 mmol/L (ref 135–145)
TOTAL PROTEIN: 5 g/dL — AB (ref 6.5–8.1)

## 2015-09-17 LAB — ECHOCARDIOGRAM COMPLETE
AOPV: 0.97 m/s
AOVTI: 17.9 cm
AV Area VTI: 3.05 cm2
AV Area mean vel: 2.83 cm2
AV Peak grad: 4 mmHg
AV VEL mean LVOT/AV: 0.9
AV pk vel: 106 cm/s
AVAREAMEANVIN: 1.56 cm2/m2
AVAREAVTIIND: 1.71 cm2/m2
AVG: 3 mmHg
CHL CUP AV PEAK INDEX: 1.69
CHL CUP AV VEL: 3.09
CHL CUP MV DEC (S): 204
DOP CAL AO MEAN VELOCITY: 77.4 cm/s
E decel time: 204 msec
EERAT: 9.6
FS: 32 % (ref 28–44)
IV/PV OW: 0.98
LA ID, A-P, ES: 30 mm
LA vol index: 19.4 mL/m2
LA vol: 35.1 mL
LADIAMINDEX: 1.66 cm/m2
LAVOLA4C: 28.3 mL
LEFT ATRIUM END SYS DIAM: 30 mm
LV E/e'average: 9.6
LV TDI E'LATERAL: 6.42
LV dias vol index: 40 mL/m2
LV dias vol: 72 mL (ref 46–106)
LVEEMED: 9.6
LVELAT: 6.42 cm/s
LVOT SV: 55 mL
LVOT VTI: 17.6 cm
LVOT area: 3.14 cm2
LVOT diameter: 20 mm
LVOT peak VTI: 0.98 cm
LVOT peak vel: 103 cm/s
LVSYSVOL: 33 mL (ref 14–42)
LVSYSVOLIN: 18 mL/m2
MVPKAVEL: 71.8 m/s
MVPKEVEL: 61.6 m/s
PW: 10.2 mm — AB (ref 0.6–1.1)
Simpson's disk: 54
Stroke v: 39 ml
TAPSE: 20.8 mm
Valve area index: 1.71
Valve area: 3.09 cm2

## 2015-09-17 LAB — T4, FREE: Free T4: 0.9 ng/dL (ref 0.61–1.12)

## 2015-09-17 LAB — CBC
HCT: 40.7 % (ref 36.0–46.0)
HEMOGLOBIN: 13.7 g/dL (ref 12.0–15.0)
MCH: 32.9 pg (ref 26.0–34.0)
MCHC: 33.7 g/dL (ref 30.0–36.0)
MCV: 97.6 fL (ref 78.0–100.0)
Platelets: 144 10*3/uL — ABNORMAL LOW (ref 150–400)
RBC: 4.17 MIL/uL (ref 3.87–5.11)
RDW: 12.8 % (ref 11.5–15.5)
WBC: 12.6 10*3/uL — AB (ref 4.0–10.5)

## 2015-09-17 LAB — TSH: TSH: 0.167 u[IU]/mL — ABNORMAL LOW (ref 0.350–4.500)

## 2015-09-17 MED ORDER — METHYLPREDNISOLONE SODIUM SUCC 40 MG IJ SOLR
40.0000 mg | Freq: Two times a day (BID) | INTRAMUSCULAR | Status: DC
  Administered 2015-09-17 – 2015-09-18 (×3): 40 mg via INTRAVENOUS
  Filled 2015-09-17 (×3): qty 1

## 2015-09-17 MED ORDER — POTASSIUM CHLORIDE CRYS ER 20 MEQ PO TBCR
40.0000 meq | EXTENDED_RELEASE_TABLET | Freq: Once | ORAL | Status: AC
  Administered 2015-09-17: 40 meq via ORAL
  Filled 2015-09-17: qty 2

## 2015-09-17 MED ORDER — FAMOTIDINE 20 MG PO TABS
20.0000 mg | ORAL_TABLET | Freq: Two times a day (BID) | ORAL | Status: DC
  Administered 2015-09-17 – 2015-09-18 (×2): 20 mg via ORAL
  Filled 2015-09-17 (×2): qty 1

## 2015-09-17 NOTE — Evaluation (Signed)
Physical Therapy Evaluation Patient Details Name: Marissa Gibson MRN: [id number] DOB: Jan 16, 1976 Today's Date: 09/17/2015   History of Present Illness  40 y.o. female with medical history significant for severe persistent allergic asthma, steroid dependent. She is [facility] by pulmonology. She also has issues with endometriosis and interstitial cystitis on twice a day bladder irrigations via I/O cath with a solution of Marcaine, bicarbonate and heparin. She's had repeated hospitalizations for asthma exacerbations and most recently has not transitioned well from Solu-Medrol to prednisone prompting placement of a PICC line with initiation of Solu-Medrol 60 mg IV every 12 hours on 5/31.Head CT was unremarkable. Neurology was suspicious patient could have steroid-induced mental status changes versus opportunistic infection versus seizure. EEG was recommended and tentative report is abnormal waveforms in the frontal lobe. MRI of the brain was also recommended. LP was done and did not show any evidence of infection or meningitis. Neurology/pulmonary were consulted MRI negative   Clinical Impression  Pt presents with significant functional limitations due to profound cognitive changes.  While she is able to inconsistently follow commands, she is disoriented about self, place, situation, etc.  She repeatedly and with some anxiety states "I don't exist.  I'm looking right at you and I'm not here."  Family is understandably concerned and has many questions about pt's status and medical workup.    Assuming pt's cognition clears, recommend home with HHPT to address mild weakness and fatigue and return to max independence, however pt will need to have 24 supervision or to demonstrate return of cognitive function in order to safely d/c home.  PT will follow and provide care to prevent further loss of functional and strength with hope that medical etiology can be discovered and addressed and  that pt can safely return home.  Will reassess with each visit and update recommendations as appropriate.      Follow Up Recommendations Home health PT;Supervision/Assistance - 24 hour    Equipment Recommendations  None recommended by PT    Recommendations for Other Services       Precautions / Restrictions Precautions Precautions: Fall Precaution Comments: up with assist, alarm Restrictions Weight Bearing Restrictions: No      Mobility  Bed Mobility Overal bed mobility: Needs Assistance Bed Mobility: Supine to Sit;Sit to Supine     Supine to sit: Supervision Sit to supine: Supervision   General bed mobility comments: Supervision for safety with direct one step verbal cues for initiation and sequencing.  Transfers Overall transfer level: Needs assistance Equipment used: None Transfers: Sit to/from Stand Sit to Stand: Min guard         General transfer comment: touch cues and with occasional minor assist to maintain attention to task  Ambulation/Gait Ambulation/Gait assistance: Min assist Ambulation Distance (Feet): 150 Feet Assistive device: 1 person hand held assist Gait Pattern/deviations: Step-through pattern;Decreased stride length     General Gait Details: slow.  provided side by side guarding with arm around trunk for security; pt needs verbal cues to navigate, 75% able to walk/talk and seems to recall who is in room, talk about cats at home, but also states "i'm not here, I don't exist"  Stairs            Wheelchair Mobility    Modified Rankin (Stroke Patients Only)       Balance Overall balance assessment: No apparent balance deficits (not formally assessed) (to be assessed with incr mentation) Sitting-balance support: No upper extremity supported;Feet supported Sitting balance-Leahy Scale: Good  Standing balance support: No upper extremity supported;During functional activity Standing balance-Leahy Scale: Fair                                Pertinent Vitals/Pain Pain Assessment: Faces Faces Pain Scale: Hurts little more Pain Location: pt rubbing chest; husband reports pts stomach has been hurting Pain Descriptors / Indicators: Other (Comment);Grimacing (rubbing) Pain Intervention(s): Monitored during session    Home Living Family/patient expects to be discharged to:: Private residence Living Arrangements: Spouse/significant other Available Help at Discharge: Family;Available PRN/intermittently Type of Home: House Home Access: Stairs to enter Entrance Stairs-Rails: Can reach both Entrance Stairs-Number of Steps: 13 (from basement to 1st floor) Home Layout: Multi-level;Able to live on main level with bedroom/bathroom Home Equipment: None      Prior Function Level of Independence: Needs assistance   Gait / Transfers Assistance Needed: independent  ADL's / Homemaking Assistance Needed: Husband assisting with baths since PICC line placement. Otherwise overall independent.        Hand Dominance   Dominant Hand: Right    Extremity/Trunk Assessment   Upper Extremity Assessment: Defer to OT evaluation           Lower Extremity Assessment: Overall WFL for tasks assessed;Generalized weakness      Cervical / Trunk Assessment: Normal  Communication   Communication: Other (comment) (intermittently responding to questions)  Cognition Arousal/Alertness: Awake/alert Behavior During Therapy: Flat affect Overall Cognitive Status: Impaired/Different from baseline Area of Impairment: Orientation;Attention;Memory;Following commands;Safety/judgement;Awareness;Problem solving Orientation Level: Disoriented to;Place;Time;Situation Current Attention Level: Focused Memory: Decreased short-term memory Following Commands: Follows one step commands inconsistently;Follows one step commands with increased time Safety/Judgement: Decreased awareness of safety;Decreased awareness of deficits Awareness:  Intellectual Problem Solving: Slow processing;Decreased initiation;Difficulty sequencing;Requires verbal cues;Requires tactile cues General Comments: Pt's primary limitation is cognitive, where pt will stare at therapist following a question, says 'no' to most questions, states "I don't exist; I'm not here", but is able to recall who is in her room, that she has cats at home, other small details upon questioning -- one interpretation is that pt is aware at some level that she is profoundly confused and cannot make sense of her current situation.  Her family is deeply worried    General Comments      Exercises        Assessment/Plan    PT Assessment Patient needs continued PT services  PT Diagnosis Difficulty walking;Altered mental status   PT Problem List Decreased knowledge of precautions;Decreased safety awareness;Decreased mobility;Decreased activity tolerance  PT Treatment Interventions DME instruction;Gait training;Stair training;Functional mobility training;Therapeutic activities;Therapeutic exercise;Neuromuscular re-education;Cognitive remediation;Patient/family education   PT Goals (Current goals can be found in the Care Plan section) Acute Rehab PT Goals Patient Stated Goal: none stated; familys goal is to figure out what is going on with pt PT Goal Formulation: With family Time For Goal Achievement: 10/01/15 Potential to Achieve Goals: Good    Frequency Min 3X/week   Barriers to discharge Decreased caregiver support does not have 24 hour supervision currently available    Co-evaluation               End of Session   Activity Tolerance: Patient tolerated treatment well Patient left: in bed;with call bell/phone within reach;with family/visitor present Nurse Communication: Mobility status    Functional Assessment Tool Used: clinical judgment Functional Limitation: Mobility: Walking and moving around Mobility: Walking and Moving Around Current Status (Z6109): At  least 60 percent but  less than 80 percent impaired, limited or restricted Mobility: Walking and Moving Around Goal Status [phone]): At least 1 percent but less than 20 percent impaired, limited or restricted    Time: 1415-1440 PT Time Calculation (min) (ACUTE ONLY): 25 min   Charges:   PT Evaluation $PT Eval Moderate Complexity: 1 Procedure     PT G Codes:   PT G-Codes **NOT FOR INPATIENT CLASS** Functional Assessment Tool Used: clinical judgment Functional Limitation: Mobility: Walking and moving around Mobility: Walking and Moving Around Current Status (U0454): At least 60 percent but less than 80 percent impaired, limited or restricted Mobility: Walking and Moving Around Goal Status [phone]): At least 1 percent but less than 20 percent impaired, limited or restricted    Dennis Bast 09/17/2015, 4:37 PM

## 2015-09-17 NOTE — Plan of Care (Signed)
Problem: Activity: Goal: Risk for activity intolerance will decrease Outcome: Progressing Pt. Up in chair today and worked with PT/OT

## 2015-09-17 NOTE — Progress Notes (Addendum)
Subjective: family reports subjective improvement  Exam: Filed Vitals:   09/17/15 1112 09/17/15 1257  BP: 135/91 142/94  Pulse: 124 110  Temp:  98.4 F (36.9 C)  Resp:  16   Gen: In bed, NAD Resp: non-labored breathing, no acute distress Abd: soft, nt  Neuro: MS: wake, alert, does not help her several speech this morning. Is able to answe questions relatively appropriately. CN: EOMI, face symmetric Motor: Moves all extremities well Sensory: Intact to light touch  Pertinent Labs: CSF mildly elevated protein 15, normal glucose RBC 16 the BB C2 IgG index pending  Impression: 40 year old female with altered mental status in the setting of 2 weeks of relatively high-dose Solu-Medrol. I suspect steroid-induced mental status changes. With normal MRI, CSF, only mildly abnormal EEG I think other etiologies are less likely at this point.  Recommendations: 1) wean steroids as tolerated 2) we will continue to follow.  Ritta SlotMcNeill Nakiea Metzner, MD [facility] [phone]  If 7pm- 7am, please page neurology on call as listed in [facility].

## 2015-09-17 NOTE — Evaluation (Signed)
Occupational Therapy Evaluation Patient Details Name: Marissa Gibson MRN: [id number] DOB: 06-04-1975 Today's Date: 09/17/2015    History of Present Illness 40 y.o. female with medical history significant for severe persistent allergic asthma, steroid dependent. The husband noted that over the past couple of days patient seemed to be more lethargic and confused just progressed in the past 24 hours. Per husband pt fell in the bathroom ~2 days PTA; CT and MRI on 6/10 negative for acute abnormalities.      Clinical Impression   Pts husband reports she was independent with ADLs and mobility PTA; was requiring min assist with bathing due to PICC line. Currently pt requires min guard overall for ADLs and min assist for functional mobility. Pt requires consistent direct one step verbal cues to participate in activities during session. Pt frequently states "no" when asked a question (e.g. "can you take off your sock?") but will complete task if stated in a command (e.g. "take off your sock). Pt inconsistently responding to questions/commands. Mild instability noted during functional mobility requiring min assist for steadying and regaining balance. At this time, recommending HHOT for follow up. Depending on progression of pts mentation; follow up recommendations may need to be updated. Currently pt planning to d/c home with intermittent supervision from her husband who works third shift. Pt would benefit from continued skilled OT to address established goals.    Follow Up Recommendations  Home health OT;Supervision/Assistance - 24 hour    Equipment Recommendations  None recommended by OT    Recommendations for Other Services       Precautions / Restrictions Precautions Precautions: Fall Restrictions Weight Bearing Restrictions: No      Mobility Bed Mobility Overal bed mobility: Needs Assistance Bed Mobility: Supine to Sit;Sit to Supine     Supine to sit: Supervision Sit to supine:  Supervision   General bed mobility comments: Supervision for safety with direct one step verbal cues for initiation and sequencing.  Transfers Overall transfer level: Needs assistance Equipment used: None Transfers: Sit to/from Stand Sit to Stand: Min guard         General transfer comment: Min guard for safety with sit to stand from EOB. Pt with multiple small LOB during functional mobility requiring min assist to steady.    Balance Overall balance assessment: Needs assistance Sitting-balance support: Feet supported;No upper extremity supported Sitting balance-Leahy Scale: Good     Standing balance support: No upper extremity supported;During functional activity Standing balance-Leahy Scale: Fair                              ADL Overall ADL's : Needs assistance/impaired     Grooming: Standing;Cueing for sequencing;Cueing for safety;Min guard Grooming Details (indicate cue type and reason): Cues for sequencing and initiation. Upper Body Bathing: Set up;Supervision/ safety;Sitting Upper Body Bathing Details (indicate cue type and reason): Cues for sequencing and initiation. Lower Body Bathing: Sit to/from stand;Cueing for safety;Cueing for sequencing;Min guard Lower Body Bathing Details (indicate cue type and reason): Cues for sequencing and initiation. Upper Body Dressing : Set up;Supervision/safety;Sitting;Cueing for safety;Cueing for sequencing Upper Body Dressing Details (indicate cue type and reason): Cues for sequencing and initiation. Lower Body Dressing: Sit to/from stand;Cueing for safety;Cueing for sequencing;Min guard Lower Body Dressing Details (indicate cue type and reason): Cues for sequencing and initiation. Pt able to doff/don sock sitting EOB. Toilet Transfer: Minimal assistance;Ambulation;Cueing for safety;Cueing for sequencing Toilet Transfer Details (indicate cue type and reason): Cues  for sequencing and initiation. Min guard-min assist at times  for balance. Toileting- Architect and Hygiene: Min guard;Sit to/from stand;Cueing for safety;Cueing for sequencing Toileting - Clothing Manipulation Details (indicate cue type and reason): Cues for sequencing and initiation.       General ADL Comments: Pt requires consistent verbal cueing to perform any functional activities. Responds "no" if you ask pt to do something (e.g., "can you take off your sock?"). But able to take off sock if phrase in direct statement ("take off your sock"). Husband reports pt fell in bath tub last week when he was out cutting grass; typically he supervises/assists with bathing due to PICC line.     Vision Additional Comments: to be further assessed   Perception     Praxis      Pertinent Vitals/Pain Pain Assessment: Faces Faces Pain Scale: Hurts little more Pain Location: pt rubbing chest; husband reports pts stomach has been hurting Pain Descriptors / Indicators: Other (Comment);Grimacing (rubbing) Pain Intervention(s): Monitored during session     Hand Dominance Right   Extremity/Trunk Assessment Upper Extremity Assessment Upper Extremity Assessment: Overall WFL for tasks assessed (inconsistent responses with UE sensation)   Lower Extremity Assessment Lower Extremity Assessment: Defer to PT evaluation   Cervical / Trunk Assessment Cervical / Trunk Assessment: Normal   Communication Communication Communication: Other (comment) (intermittently responding to questions)   Cognition Arousal/Alertness: Awake/alert Behavior During Therapy: Flat affect Overall Cognitive Status: Impaired/Different from baseline Area of Impairment: Orientation;Attention;Memory;Following commands;Safety/judgement;Awareness Orientation Level: Disoriented to;Place;Time;Situation Current Attention Level: Focused Memory: Decreased short-term memory Following Commands: Follows one step commands inconsistently;Follows one step commands with increased  time Safety/Judgement: Decreased awareness of safety;Decreased awareness of deficits Awareness: Intellectual   General Comments: Pt inconsistently responding to questions; will look directly at therapist but not respond. Only responds physically do direct one step commands.   General Comments       Exercises       Shoulder Instructions      Home Living Family/patient expects to be discharged to:: Private residence Living Arrangements: Spouse/significant other Available Help at Discharge: Family;Available PRN/intermittently Type of Home: House       Home Layout: Multi-level;Able to live on main level with bedroom/bathroom     Bathroom Shower/Tub: Tub/shower unit Shower/tub characteristics: Engineer, building services: Standard     Home Equipment: None          Prior Functioning/Environment Level of Independence: Needs assistance    ADL's / Homemaking Assistance Needed: Husband assisting with baths since PICC line placement. Otherwise overall independent.        OT Diagnosis: Cognitive deficits;Generalized weakness;Altered mental status   OT Problem List: Decreased strength;Impaired balance (sitting and/or standing);Decreased cognition;Decreased safety awareness;Pain   OT Treatment/Interventions: Self-care/ADL training;Therapeutic exercise;Energy conservation;DME and/or AE instruction;Therapeutic activities;Cognitive remediation/compensation;Patient/family education;Balance training    OT Goals(Current goals can be found in the care plan section) Acute Rehab OT Goals Patient Stated Goal: none stated; familys goal is to figure out what is going on with pt OT Goal Formulation: With family Time For Goal Achievement: 10/01/15 Potential to Achieve Goals: Good ADL Goals Pt Will Perform Grooming: with modified independence;standing Pt Will Perform Upper Body Bathing: with modified independence;sitting;standing Pt Will Perform Lower Body Bathing: with modified  independence;sit to/from stand Pt Will Transfer to Toilet: with modified independence;ambulating;regular height toilet Pt Will Perform Toileting - Clothing Manipulation and hygiene: with modified independence;sit to/from stand  OT Frequency: Min 2X/week   Barriers to D/C: Decreased caregiver support  husband works third  shift       Co-evaluation              End of Session    Activity Tolerance: Patient tolerated treatment well Patient left: in bed;with call bell/phone within reach;with family/visitor present   Time: 1610-9604 OT Time Calculation (min): 17 min Charges:  OT General Charges $OT Visit: 1 Procedure OT Evaluation $OT Eval Moderate Complexity: 1 Procedure G-Codes: OT G-codes **NOT FOR INPATIENT CLASS** Functional Assessment Tool Used: Clinical judgement Functional Limitation: Self care Self Care Current Status (V4098): At least 1 percent but less than 20 percent impaired, limited or restricted Self Care Goal Status (J1914): 0 percent impaired, limited or restricted   Gaye Alken M.S., OTR/L Pager: [phone]  09/17/2015, 4:06 PM

## 2015-09-17 NOTE — Progress Notes (Signed)
[facility] PROGRESS NOTE  Marissa Gibson ZOX:[id number] DOB: 14-Nov-1975 DOA: 09/16/2015   PCP: Thora Lance, MD     Assessment/Plan: Principal Problem:   Acute encephalopathy Active Problems:   Leukocytosis   Interstitial cystitis   Essential hypertension   Physical deconditioning   Oral thrush   Severe persistent asthma   Steroid-dependent asthma   Altered mental status   40 y.o. female with medical history significant for severe persistent allergic asthma, steroid dependent. She is typically followed at [facility] by pulmonology. She also has issues with endometriosis and interstitial cystitis on twice a day bladder irrigations via I/O cath with a solution of Marcaine, bicarbonate and heparin. She's had repeated hospitalizations for asthma exacerbations and most recently has not transitioned well from Solu-Medrol to prednisone prompting placement of a PICC line with initiation of Solu-Medrol 60 mg IV every 12 hours on 5/31.Head CT was unremarkable. Neurology was suspicious patient could have steroid-induced mental status changes versus opportunistic infection versus seizure. EEG was recommended and tentative report is abnormal waveforms in the frontal lobe. MRI of the brain was also recommended. LP was done and did not show any evidence of infection or meningitis. Neurology/pulmonary were consulted  Assessment and plan  Acute drug-induced encephalopathy -Patient presents with encephalopathy that began after initiation of relatively high dose IV steroids in the home environment -Differential includes: Steroid induced mental status changes/psychoses, opportunistic infection in setting of recurrent use of steroids, seizure activity and less likely stroke EEG shows frontally predominant irregular delta activity consistent with generalized nonspecific cerebral dysfunction MRI of the brain negative, Lumbar puncture not consistent with meningitis   no medical signs of  infection present,Pro calcitonin negative Possibilities include steroid-induced mental status changes, opportunistic infection, seizure, less likely to be stroke.,        Severe persistent asthma/Steroid-dependent asthma -Followed by pulmonology at [facility] resolution CT scan of the chest obtained 4/25 revealed extensive atelectasis and or post infectious/inflammatory scarring throughout the lung bases bilaterally predominantly in the lower lobes but no typical stigmata that would be suggestive of interstitial lung disease -ABG here unremarkable -A1 antitrypsin serologies negative -IgE serology negative/within normal limits -PFTs performed in April of this year within normal limits --Per recommendation from neurology had decreased Solu-Medrol to 60 mg IV every 12 hours given concerns of steroid induced mental status changes, start slowly tapering steroids, will reduce Solu-Medrol to 40 mg IV every 12 -Patient on multiple MDIs and nebulizers at home and these have been reordered -She has never been on PPI to cover for possible reflux/vocal cord dysfunction- started on PPI  2-D echo pending for further evaluation of her shortness of breath Pulmonary requested to make further recommendations    Leukocytosis -Likely related to steroid use but as precaution and infectious workup has been initiated -Since no clearly identifiable source of infection and no constitutional symptoms and patient otherwise hemodynamically stable no indication to initiate empiric antibiotics at this juncture   Essential hypertension -Continue preadmission Cozaar and Norvasc   Interstitial cystitis -I have asked pharmacy to clarify solution for twice a day bladder irrigations   Physical deconditioning -PT/ OT evaluation this admission -Concern with frequent use of steroids patient may be developing steroid-induced myopathy   Oral thrush --Reports that patient has "really bad thrush" but tongue appears  normal but unable to visualize posterior pharynx   Hypokalemia-replete, check magnesium  Low TSH-check free T4   DVT prophylaxsis Lovenox  Code Status:  Full code  Family Communication: Discussed in detail with the patient/father, all imaging results, lab results explained to the patient   Disposition Plan:  Anticipate discharge tomorrow      Consultants:  Neurology  Pulmonary    Procedures:  None     Antibiotics: Anti-infectives    None         HPI/Subjective: Patient much more awake and alert this morning, patient's father is by the bedside currently on room air not requiring any oxygen mental status is improving  Objective: Filed Vitals:   09/16/15 2134 09/17/15 0523 09/17/15 0825 09/17/15 1112  BP: 142/102 125/88  135/91  Pulse: 112 108  124  Temp: 99.2 F (37.3 C) 98.8 F (37.1 C)    TempSrc: Oral Oral    Resp: 16 16    SpO2: 96% 96% 95%     Intake/Output Summary (Last 24 hours) at 09/17/15 1153 Last data filed at 09/17/15 1102  Gross per 24 hour  Intake 743.75 ml  Output   2100 ml  Net -1356.25 ml    Exam:  Examination:  General exam: Appears calm and comfortable  Respiratory system: Clear to auscultation. Respiratory effort normal. Cardiovascular system: S1 & S2 heard, RRR. No JVD, murmurs, rubs, gallops or clicks. No pedal edema. Gastrointestinal system: Abdomen is nondistended, soft and nontender. No organomegaly or masses felt. Normal bowel sounds heard. Central nervous system: Alert and oriented. No focal neurological deficits. Extremities: Symmetric 5 x 5 power. Skin: No rashes, lesions or ulcers Psychiatry: Judgement and insight appear normal. Mood & affect appropriate.     Data Reviewed: I have personally reviewed following labs and imaging studies  Micro Results Recent Results (from the past 240 hour(s))  CSF culture     Status: None (Preliminary result)   Collection Time: 09/16/15 12:57 PM  Result Value Ref  Range Status   Specimen Description CSF  Final   Special Requests NONE  Final   Gram Stain NO WBC SEEN NO ORGANISMS SEEN CYTOSPIN SMEAR   Final   Culture PENDING  Incomplete   Report Status PENDING  Incomplete  Culture, blood (Routine X 2) w Reflex to ID Panel     Status: None (Preliminary result)   Collection Time: 09/16/15  3:46 PM  Result Value Ref Range Status   Specimen Description BLOOD RIGHT HAND  Final   Special Requests BOTTLES DRAWN AEROBIC AND ANAEROBIC 5CC  Final   Culture NO GROWTH < 24 HOURS  Final   Report Status PENDING  Incomplete  Culture, blood (Routine X 2) w Reflex to ID Panel     Status: None (Preliminary result)   Collection Time: 09/16/15  3:52 PM  Result Value Ref Range Status   Specimen Description BLOOD LEFT HAND  Final   Special Requests BOTTLES DRAWN AEROBIC AND ANAEROBIC 5CC  Final   Culture NO GROWTH < 24 HOURS  Final   Report Status PENDING  Incomplete    Radiology Reports Dg Chest 2 View  09/16/2015  CLINICAL DATA:  Altered mental status EXAM: CHEST  2 VIEW COMPARISON:  July 31, 2015 FINDINGS: A right PICC line has been placed in the interval and is in good position. The frontal view is otherwise normal. Mild increased opacity projected over the heart on the lateral view, new in the interval. No other acute abnormalities. IMPRESSION: Mild opacity projected over the heart on the lateral view. Recommend follow-up to resolution. The right PICC line is in good position. Electronically Signed   By: Gerome Samavid  Williams  III M.D   On: 09/16/2015 09:08   Ct Head Wo Contrast  09/16/2015  CLINICAL DATA:  Initial encounter for PT. FELL Thursday, TODAY EXPERIENCING AMS, FAMILY MEMBERS EXPLAINS SHE IS NOT ACTING HERSELF, CONFUSION, PT. WITH PICC FOR ASTHMA, PT. SAYS SHE DOES KNOW IF SHE HIT HER HEAD OR NOT, DENIES PAIN-HEMATOMA, LW EXAM: CT HEAD WITHOUT CONTRAST TECHNIQUE: Contiguous axial images were obtained from the base of the skull through the vertex without  intravenous contrast. COMPARISON:  05/30/2008 FINDINGS: Sinuses/Soft tissues: Clear paranasal sinuses and mastoid air cells. Intracranial: No mass lesion, hemorrhage, hydrocephalus, acute infarct, intra-axial, or extra-axial fluid collection. IMPRESSION: Normal head CT. Electronically Signed   By: Jeronimo Greaves M.D.   On: 09/16/2015 10:50   Mr Brain Wo Contrast  09/16/2015  CLINICAL DATA:  Acute presentation with encephalopathy after steroid treatment. EXAM: MRI HEAD WITHOUT CONTRAST TECHNIQUE: Multiplanar, multiecho pulse sequences of the brain and surrounding structures were obtained without intravenous contrast. COMPARISON:  Head CT same day FINDINGS: The brain has a normal appearance on all pulse sequences without evidence of malformation, atrophy, old or acute infarction, mass lesion, hemorrhage, hydrocephalus or extra-axial collection. No pituitary mass. No fluid in the sinuses, middle ears or mastoids. No skull or skullbase lesion. There is flow in the major vessels at the base of the brain. Major venous sinuses show flow. IMPRESSION: Normal examination. No finding to explain the clinical presentation. Electronically Signed   By: Paulina Fusi M.D.   On: 09/16/2015 20:30     CBC  Recent Labs Lab 09/16/15 0844 09/17/15 0500  WBC 14.6* 12.6*  HGB 14.4 13.7  HCT 43.4 40.7  PLT 154 144*  MCV 98.2 97.6  MCH 32.6 32.9  MCHC 33.2 33.7  RDW 12.8 12.8  LYMPHSABS 1.3  --   MONOABS 1.2*  --   EOSABS 0.0  --   BASOSABS 0.0  --     Chemistries   Recent Labs Lab 09/16/15 0844 09/17/15 0500  NA 137 137  K 3.8 3.1*  CL 105 104  CO2 23 25  GLUCOSE 98 101*  BUN 22* 14  CREATININE 0.61 0.46  CALCIUM 9.4 8.7*  MG 2.3  --   AST 19 17  ALT 34 32  ALKPHOS 51 50  BILITOT 1.0 0.9   ------------------------------------------------------------------------------------------------------------------ CrCl cannot be calculated (Unknown ideal  weight.). ------------------------------------------------------------------------------------------------------------------ No results for input(s): HGBA1C in the last 72 hours. ------------------------------------------------------------------------------------------------------------------ No results for input(s): CHOL, HDL, LDLCALC, TRIG, CHOLHDL, LDLDIRECT in the last 72 hours. ------------------------------------------------------------------------------------------------------------------  Recent Labs  09/16/15 1759  TSH 0.167*   ------------------------------------------------------------------------------------------------------------------ No results for input(s): VITAMINB12, FOLATE, FERRITIN, TIBC, IRON, RETICCTPCT in the last 72 hours.  Coagulation profile No results for input(s): INR, PROTIME in the last 168 hours.  No results for input(s): DDIMER in the last 72 hours.  Cardiac Enzymes  Recent Labs Lab 09/16/15 0844  TROPONINI <0.03   ------------------------------------------------------------------------------------------------------------------ Invalid input(s): POCBNP   CBG: No results for input(s): GLUCAP in the last 168 hours.     Studies: Dg Chest 2 View  09/16/2015  CLINICAL DATA:  Altered mental status EXAM: CHEST  2 VIEW COMPARISON:  July 31, 2015 FINDINGS: A right PICC line has been placed in the interval and is in good position. The frontal view is otherwise normal. Mild increased opacity projected over the heart on the lateral view, new in the interval. No other acute abnormalities. IMPRESSION: Mild opacity projected over the heart on the lateral view. Recommend follow-up to resolution. The  right PICC line is in good position. Electronically Signed   By: Gerome Sam III M.D   On: 09/16/2015 09:08   Ct Head Wo Contrast  09/16/2015  CLINICAL DATA:  Initial encounter for PT. FELL Thursday, TODAY EXPERIENCING AMS, FAMILY MEMBERS EXPLAINS SHE IS NOT  ACTING HERSELF, CONFUSION, PT. WITH PICC FOR ASTHMA, PT. SAYS SHE DOES KNOW IF SHE HIT HER HEAD OR NOT, DENIES PAIN-HEMATOMA, LW EXAM: CT HEAD WITHOUT CONTRAST TECHNIQUE: Contiguous axial images were obtained from the base of the skull through the vertex without intravenous contrast. COMPARISON:  05/30/2008 FINDINGS: Sinuses/Soft tissues: Clear paranasal sinuses and mastoid air cells. Intracranial: No mass lesion, hemorrhage, hydrocephalus, acute infarct, intra-axial, or extra-axial fluid collection. IMPRESSION: Normal head CT. Electronically Signed   By: Jeronimo Greaves M.D.   On: 09/16/2015 10:50   Mr Brain Wo Contrast  09/16/2015  CLINICAL DATA:  Acute presentation with encephalopathy after steroid treatment. EXAM: MRI HEAD WITHOUT CONTRAST TECHNIQUE: Multiplanar, multiecho pulse sequences of the brain and surrounding structures were obtained without intravenous contrast. COMPARISON:  Head CT same day FINDINGS: The brain has a normal appearance on all pulse sequences without evidence of malformation, atrophy, old or acute infarction, mass lesion, hemorrhage, hydrocephalus or extra-axial collection. No pituitary mass. No fluid in the sinuses, middle ears or mastoids. No skull or skullbase lesion. There is flow in the major vessels at the base of the brain. Major venous sinuses show flow. IMPRESSION: Normal examination. No finding to explain the clinical presentation. Electronically Signed   By: Paulina Fusi M.D.   On: 09/16/2015 20:30      No results found for: HGBA1C Lab Results  Component Value Date   CREATININE 0.46 09/17/2015       Scheduled Meds: . amLODipine  5 mg Oral Daily  . budesonide (PULMICORT) nebulizer solution  0.25 mg Nebulization TID  . calcium-vitamin D  1 tablet Oral BID WC  . cholestyramine  4 g Oral Q12H  . clotrimazole  1 Applicatorful Vaginal QHS  . cycloSPORINE  2 drop Both Eyes BID  . enoxaparin (LOVENOX) injection  40 mg Subcutaneous Q24H  . [START ON 09/20/2015]  estradiol  0.0375 mg Transdermal Q Wed  . famotidine (PEPCID) IV  20 mg Intravenous Q12H  . fluticasone  2 spray Each Nare Daily  . hydrocortisone   Rectal QID  . ipratropium  1 spray Each Nare BID  . loratadine  10 mg Oral Daily  . losartan  50 mg Oral Daily  . magnesium oxide  400 mg Oral Daily  . methylPREDNISolone (SOLU-MEDROL) injection  40 mg Intravenous Q12H  . multivitamin with minerals  1 tablet Oral Daily  . pentosan polysulfate  100 mg Oral TID  . QUEtiapine  25 mg Oral QHS  . saccharomyces boulardii  250 mg Oral Daily  . URELLE  1 tablet Oral BID   Continuous Infusions: . sodium chloride 75 mL/hr at 09/16/15 2313        Time spent: >30 MINS    [facility]  [facility] Pager [phone]. If 7PM-7AM, please contact night-coverage at [email or web address], password [email or web address] 09/17/2015, 11:53 AM

## 2015-09-17 NOTE — Progress Notes (Signed)
*  PRELIMINARY RESULTS* Echocardiogram 2D Echocardiogram has been performed.  Doristine SectionKristy H Armenia Silveria 09/17/2015, 11:57 AM

## 2015-09-18 DIAGNOSIS — R400 Somnolence: Secondary | ICD-10-CM

## 2015-09-18 LAB — PROCALCITONIN: Procalcitonin: <0.1 ng/mL

## 2015-09-18 LAB — COMPREHENSIVE METABOLIC PANEL
ALBUMIN: 2.8 g/dL — AB (ref 3.5–5.0)
ALK PHOS: 45 U/L (ref 38–126)
ALT: 28 U/L (ref 14–54)
AST: 13 U/L — AB (ref 15–41)
Anion gap: 7 (ref 5–15)
BUN: 9 mg/dL (ref 6–20)
CALCIUM: 9.1 mg/dL (ref 8.9–10.3)
CHLORIDE: 105 mmol/L (ref 101–111)
CO2: 26 mmol/L (ref 22–32)
CREATININE: 0.44 mg/dL (ref 0.44–1.00)
GFR calc non Af Amer: >60 mL/min (ref >60)
GLUCOSE: 107 mg/dL — AB (ref 65–99)
Potassium: 3.7 mmol/L (ref 3.5–5.1)
SODIUM: 138 mmol/L (ref 135–145)
Total Bilirubin: 0.8 mg/dL (ref 0.3–1.2)
Total Protein: 5.1 g/dL — ABNORMAL LOW (ref 6.5–8.1)

## 2015-09-18 LAB — IGG CSF INDEX
ALBUMIN CSF-MCNC: 29 mg/dL (ref 11–48)
ALBUMIN: 3.7 g/dL (ref 3.5–5.5)
CSF IgG Index: 0.7 (ref 0.0–0.7)
IGG (IMMUNOGLOBIN G), SERUM: 440 mg/dL — AB (ref 700–1600)
IGG CSF: 2.4 mg/dL (ref 0.0–8.6)
IgG/Alb Ratio, CSF: 0.08 (ref 0.00–0.25)

## 2015-09-18 LAB — CBC
HCT: 39.5 % (ref 36.0–46.0)
HEMOGLOBIN: 13.3 g/dL (ref 12.0–15.0)
MCH: 32.6 pg (ref 26.0–34.0)
MCHC: 33.7 g/dL (ref 30.0–36.0)
MCV: 96.8 fL (ref 78.0–100.0)
PLATELETS: 161 10*3/uL (ref 150–400)
RBC: 4.08 MIL/uL (ref 3.87–5.11)
RDW: 12.6 % (ref 11.5–15.5)
WBC: 11.3 10*3/uL — AB (ref 4.0–10.5)

## 2015-09-18 LAB — HERPES SIMPLEX VIRUS(HSV) DNA BY PCR
HSV 1 DNA: NEGATIVE
HSV 2 DNA: NEGATIVE

## 2015-09-18 MED ORDER — POTASSIUM CHLORIDE ER 10 MEQ PO TBCR: 10.0000 meq | EXTENDED_RELEASE_TABLET | Freq: Every day | ORAL | Status: DC

## 2015-09-18 MED ORDER — HEPARIN SOD (PORK) LOCK FLUSH 100 UNIT/ML IV SOLN
250.0000 [IU] | INTRAVENOUS | Status: AC | PRN
Start: 2015-09-18 — End: 2015-09-18
  Administered 2015-09-18: 250 [IU]

## 2015-09-18 MED ORDER — FUROSEMIDE 20 MG PO TABS: 20.0000 mg | ORAL_TABLET | Freq: Every day | ORAL | Status: DC

## 2015-09-18 MED ORDER — METHYLPREDNISOLONE SODIUM SUCC 40 MG IJ SOLR: 40.0000 mg | Freq: Two times a day (BID) | INTRAMUSCULAR | Status: DC

## 2015-09-18 NOTE — Progress Notes (Signed)
NURSING PROGRESS NOTE  Marissa Gibson [id number] Discharge Data: 09/18/2015 8:23 PM Attending Provider: No att. providers found WUJ:[id number],WGNFAOPCP:EHINGER,ROBERT R, MD     Marissa Gibson to be D/C'd Home per Abrol,MD order.  Discussed with the patient and her husband the After Visit Summary and all questions fully answered. Her PICC line was heparin locked by IV team with no bleeding noted. All belongings returned to patient for patient to take home. Pt given prescriptions and teaching on her new medications. Pt was taken downstairs via wheelchair accompanied by a staff member.  Last Vital Signs:  Blood pressure 140/94, pulse 118, temperature 98.3 F (36.8 C), temperature source Oral, resp. rate 18, SpO2 98 %.  Discharge Medication List   Medication List    TAKE these medications        acetaminophen 325 MG tablet  Commonly known as:  TYLENOL  Take 650 mg by mouth every 6 (six) hours as needed for mild pain.     albuterol 108 (90 Base) MCG/ACT inhaler  Commonly known as:  PROVENTIL HFA;VENTOLIN HFA  Inhale 1 puff into the lungs every 6 (six) hours as needed for wheezing or shortness of breath.     albuterol (2.5 MG/3ML) 0.083% nebulizer solution  Commonly known as:  PROVENTIL  Take 2.5 mg by nebulization every 8 (eight) hours as needed for wheezing or shortness of breath.     amLODipine 5 MG tablet  Commonly known as:  NORVASC  Take 5 mg by mouth daily.     beclomethasone 80 MCG/ACT inhaler  Commonly known as:  QVAR  Inhale 2 puffs into the lungs 3 (three) times daily.     bupivacaine 0.5 % Soln injection  Commonly known as:  MARCAINE  10 mLs by Infiltration route See admin instructions. 2-3 times per day combined with Sodium Bicarbonate for bladder instillation     calcium-vitamin D 500-200 MG-UNIT tablet  Commonly known as:  OSCAL WITH D  Take 1 tablet by mouth 2 (two) times daily.     cholestyramine 4 g packet  Commonly known as:  QUESTRAN  Take 4 g by mouth 2 (two) times daily.     COMBIVENT RESPIMAT 20-100 MCG/ACT Aers respimat  Generic drug:  Ipratropium-Albuterol  Inhale 1 puff into the lungs every 6 (six) hours as needed for wheezing or shortness of breath.     COSAMIN DS 500-400 MG tablet  Generic drug:  glucosamine-chondroitin  Take 1 tablet by mouth daily.     CRANBERRY EXTRACT PO  Take 1 tablet by mouth daily.     cycloSPORINE 0.05 % ophthalmic emulsion  Commonly known as:  RESTASIS  Place 2 drops into both eyes 2 (two) times daily.     EPIPEN 2-PAK 0.3 mg/0.3 mL Soaj injection  Generic drug:  EPINEPHrine  Inject 0.3 mg into the muscle as needed (for anaphylaxis).     estradiol 0.0375 mg/24hr patch  Commonly known as:  CLIMARA - Dosed in mg/24 hr  Place 0.0375 mg onto the skin every Wednesday.     FIBER PO  Take 2 tablets by mouth daily.     furosemide 20 MG tablet  Commonly known as:  LASIX  Take 1 tablet (20 mg total) by mouth daily.     guaiFENesin 600 MG 12 hr tablet  Commonly known as:  MUCINEX  Take 600 mg by mouth 2 (two) times daily as needed for cough.     HYDROcodone-acetaminophen 5-325 MG tablet  Commonly known as:  NORCO/VICODIN  Take  1 tablet by mouth every 6 (six) hours as needed for moderate pain.     ipratropium 0.06 % nasal spray  Commonly known as:  ATROVENT  Place 1 spray into both nostrils 2 (two) times daily.     levocetirizine 5 MG tablet  Commonly known as:  XYZAL  Take 5 mg by mouth every morning.     levonorgestrel 20 MCG/24HR IUD  Commonly known as:  MIRENA  1 each by Intrauterine route once.     losartan 50 MG tablet  Commonly known as:  COZAAR  Take 50 mg by mouth daily.     magnesium oxide 400 MG tablet  Commonly known as:  MAG-OX  Take 400 mg by mouth daily.     methylPREDNISolone sodium succinate 40 mg/mL injection  Commonly known as:  SOLU-MEDROL  Inject 1 mL (40 mg total) into the vein every 12 (twelve) hours.     multivitamin with minerals Tabs tablet  Take 1 tablet by mouth daily.      pentosan polysulfate 100 MG capsule  Commonly known as:  ELMIRON  Take 100 mg by mouth 3 (three) times daily.     potassium chloride 10 MEQ tablet  Commonly known as:  K-DUR  Take 1 tablet (10 mEq total) by mouth daily.     PROBIOTIC FORMULA Caps  Take 1 capsule by mouth daily.     QNASL 80 MCG/ACT Aers  Generic drug:  Beclomethasone Dipropionate  Place 1 spray into the nose 2 (two) times daily.     STIOLTO RESPIMAT 2.5-2.5 MCG/ACT Aers  Generic drug:  Tiotropium Bromide-Olodaterol  Inhale 1 puff into the lungs 2 (two) times daily.     SUPER B COMPLEX/VITAMIN C PO  Take 1 tablet by mouth daily.     terconazole 0.4 % vaginal cream  Commonly known as:  TERAZOL 7  Place 1 applicator vaginally at bedtime. Started 4/23 for 7 days ending 4/30     UROGESIC-BLUE 81.6 MG Tabs  Take 1 tablet by mouth See admin instructions. Takes 2-3 times daily     ZYFLO CR 600 MG CR tablet  Generic drug:  zileuton  Take 1,200 mg by mouth 2 (two) times daily.

## 2015-09-18 NOTE — Discharge Summary (Addendum)
Physician Discharge Summary  Elvi Leventhal MRN: [id number] DOB/AGE: May 12, 1975 40 y.o.  PCP: Simona Huh, MD   Admit date: 09/16/2015 Discharge date: 09/18/2015  Discharge Diagnoses:     Principal Problem:   Acute encephalopathy-secondary to steroids Active Problems:   Leukocytosis   Interstitial cystitis   Essential hypertension   Physical deconditioning   Oral thrush   Severe persistent asthma   Steroid-dependent asthma   Altered mental status   Encephalopathy acute    Follow-up recommendations Follow-up with PCP in 3-5 days , including all  additional recommended appointments as below Follow-up CBC, CMP in 3-5 days Patient needs to follow-up with primary care physician and pulmonologist at [facility]     Current Discharge Medication List    START taking these medications   Details  furosemide (LASIX) 20 MG tablet Take 1 tablet (20 mg total) by mouth daily. Qty: 30 tablet, Refills: 0    methylPREDNISolone sodium succinate (SOLU-MEDROL) 40 mg/mL injection Inject 1 mL (40 mg total) into the vein every 12 (twelve) hours. Qty: 1 each, Refills: 0    potassium chloride (K-DUR) 10 MEQ tablet Take 1 tablet (10 mEq total) by mouth daily. Qty: 30 tablet, Refills: 1      CONTINUE these medications which have NOT CHANGED   Details  acetaminophen (TYLENOL) 325 MG tablet Take 650 mg by mouth every 6 (six) hours as needed for mild pain.    albuterol (PROVENTIL HFA;VENTOLIN HFA) 108 (90 BASE) MCG/ACT inhaler Inhale 1 puff into the lungs every 6 (six) hours as needed for wheezing or shortness of breath.    albuterol (PROVENTIL) (2.5 MG/3ML) 0.083% nebulizer solution Take 2.5 mg by nebulization every 8 (eight) hours as needed for wheezing or shortness of breath.    amLODipine (NORVASC) 5 MG tablet Take 5 mg by mouth daily.    B Complex-C (SUPER B COMPLEX/VITAMIN C PO) Take 1 tablet by mouth daily.    beclomethasone (QVAR) 80 MCG/ACT inhaler Inhale 2 puffs into  the lungs 3 (three) times daily.    Beclomethasone Dipropionate (QNASL) 80 MCG/ACT AERS Place 1 spray into the nose 2 (two) times daily.     bupivacaine (MARCAINE) 0.5 % SOLN injection 10 mLs by Infiltration route See admin instructions. 2-3 times per day combined with Sodium Bicarbonate for bladder instillation    calcium-vitamin D (OSCAL WITH D) 500-200 MG-UNIT per tablet Take 1 tablet by mouth 2 (two) times daily.     cholestyramine (QUESTRAN) 4 g packet Take 4 g by mouth 2 (two) times daily.    CRANBERRY EXTRACT PO Take 1 tablet by mouth daily.     cycloSPORINE (RESTASIS) 0.05 % ophthalmic emulsion Place 2 drops into both eyes 2 (two) times daily.    EPINEPHrine (EPIPEN 2-PAK) 0.3 mg/0.3 mL IJ SOAJ injection Inject 0.3 mg into the muscle as needed (for anaphylaxis).    estradiol (CLIMARA - DOSED IN MG/24 HR) 0.0375 mg/24hr patch Place 0.0375 mg onto the skin every Wednesday.     FIBER PO Take 2 tablets by mouth daily.    glucosamine-chondroitin (COSAMIN DS) 500-400 MG tablet Take 1 tablet by mouth daily.    guaiFENesin (MUCINEX) 600 MG 12 hr tablet Take 600 mg by mouth 2 (two) times daily as needed for cough.    HYDROcodone-acetaminophen (NORCO/VICODIN) 5-325 MG tablet Take 1 tablet by mouth every 6 (six) hours as needed for moderate pain.    ipratropium (ATROVENT) 0.06 % nasal spray Place 1 spray into both nostrils 2 (  two) times daily.     Ipratropium-Albuterol (COMBIVENT RESPIMAT) 20-100 MCG/ACT AERS respimat Inhale 1 puff into the lungs every 6 (six) hours as needed for wheezing or shortness of breath.    levocetirizine (XYZAL) 5 MG tablet Take 5 mg by mouth every morning.     levonorgestrel (MIRENA) 20 MCG/24HR IUD 1 each by Intrauterine route once.    losartan (COZAAR) 50 MG tablet Take 50 mg by mouth daily.    magnesium oxide (MAG-OX) 400 MG tablet Take 400 mg by mouth daily.    Methen-Hyosc-Meth Blue-Na Phos (UROGESIC-BLUE) 81.6 MG TABS Take 1 tablet by mouth See  admin instructions. Takes 2-3 times daily    Multiple Vitamin (MULTIVITAMIN WITH MINERALS) TABS tablet Take 1 tablet by mouth daily.    pentosan polysulfate (ELMIRON) 100 MG capsule Take 100 mg by mouth 3 (three) times daily.    Probiotic Product (PROBIOTIC FORMULA) CAPS Take 1 capsule by mouth daily.    terconazole (TERAZOL 7) 0.4 % vaginal cream Place 1 applicator vaginally at bedtime. Started 4/23 for 7 days ending 4/30    Tiotropium Bromide-Olodaterol (STIOLTO RESPIMAT) 2.5-2.5 MCG/ACT AERS Inhale 1 puff into the lungs 2 (two) times daily.    zileuton (ZYFLO CR) 600 MG CR tablet Take 1,200 mg by mouth 2 (two) times daily.           Discharge Condition: Stable   Discharge Instructions Get Medicines reviewed and adjusted: Please take all your medications with you for your next visit with your Primary MD  Please request your Primary MD to go over all hospital tests and procedure/radiological results at the follow up, please ask your Primary MD to get all Hospital records sent to his/her office.  If you experience worsening of your admission symptoms, develop shortness of breath, life threatening emergency, suicidal or homicidal thoughts you must seek medical attention immediately by calling 911 or calling your MD immediately if symptoms less severe.  You must read complete instructions/literature along with all the possible adverse reactions/side effects for all the Medicines you take and that have been prescribed to you. Take any new Medicines after you have completely understood and accpet all the possible adverse reactions/side effects.   Do not drive when taking Pain medications.   Do not take more than prescribed Pain, Sleep and Anxiety Medications  Special Instructions: If you have smoked or chewed Tobacco in the last 2 yrs please stop smoking, stop any regular Alcohol and or any Recreational drug use.  Wear Seat belts while driving.  Please note  You were cared  for by a hospitalist during your hospital stay. Once you are discharged, your primary care physician will handle any further medical issues. Please note that NO REFILLS for any discharge medications will be authorized once you are discharged, as it is imperative that you return to your primary care physician (or establish a relationship with a primary care physician if you do not have one) for your aftercare needs so that they can reassess your need for medications and monitor your lab values.     Allergies  Allergen Reactions  . 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 Other Nausea And Vomiting    anesthesia-severe vomiting with and after sedation Cats, dogs, ragweed, mold, cockroaches, dust, grass pollens, perfumes and polyester-unspecified allergens  . Xolair [Omalizumab] Hives  . Amitriptyline Other (See Comments)    Other reaction(s): GI Upset (intolerance)  . Duloxetine Nausea And Vomiting and  Other (See Comments)    Headaches, nausea & vomiting, vertigo  . Ibuprofen Other (See Comments)    Limit ibuprofen type of meds-per MD  . Nsaids Other (See Comments)    Limit use of NSAIDS-per MD  . Ebbie Ridge [Pseudoephedrine Hcl] Other (See Comments)    lethargic  . Tape Rash    Steri-strips, bandaids, etc  . Ceclor [Cefaclor] Rash  . Cymbalta [Duloxetine Hcl] Other (See Comments)  . Neomycin-Bacitracin Zn-Polymyx Rash      Disposition: Home with home health   Consults: *  Neurology  Significant Diagnostic Studies:  Dg Chest 2 View  09/16/2015  CLINICAL DATA:  Altered mental status EXAM: CHEST  2 VIEW COMPARISON:  July 31, 2015 FINDINGS: A right PICC line has been placed in the interval and is in good position. The frontal view is otherwise normal. Mild increased opacity projected over the heart on the lateral view, new in the interval. No other acute abnormalities. IMPRESSION: Mild opacity projected over the heart on  the lateral view. Recommend follow-up to resolution. The right PICC line is in good position. Electronically Signed   By: Dorise Bullion III M.D   On: 09/16/2015 09:08   Ct Head Wo Contrast  09/16/2015  CLINICAL DATA:  Initial encounter for PT. FELL Thursday, TODAY EXPERIENCING AMS, FAMILY MEMBERS EXPLAINS SHE IS NOT ACTING HERSELF, CONFUSION, PT. WITH PICC FOR ASTHMA, PT. SAYS SHE DOES KNOW IF SHE HIT HER HEAD OR NOT, DENIES PAIN-HEMATOMA, LW EXAM: CT HEAD WITHOUT CONTRAST TECHNIQUE: Contiguous axial images were obtained from the base of the skull through the vertex without intravenous contrast. COMPARISON:  05/30/2008 FINDINGS: Sinuses/Soft tissues: Clear paranasal sinuses and mastoid air cells. Intracranial: No mass lesion, hemorrhage, hydrocephalus, acute infarct, intra-axial, or extra-axial fluid collection. IMPRESSION: Normal head CT. Electronically Signed   By: Abigail Miyamoto M.D.   On: 09/16/2015 10:50   Mr Brain Wo Contrast  09/16/2015  CLINICAL DATA:  Acute presentation with encephalopathy after steroid treatment. EXAM: MRI HEAD WITHOUT CONTRAST TECHNIQUE: Multiplanar, multiecho pulse sequences of the brain and surrounding structures were obtained without intravenous contrast. COMPARISON:  Head CT same day FINDINGS: The brain has a normal appearance on all pulse sequences without evidence of malformation, atrophy, old or acute infarction, mass lesion, hemorrhage, hydrocephalus or extra-axial collection. No pituitary mass. No fluid in the sinuses, middle ears or mastoids. No skull or skullbase lesion. There is flow in the major vessels at the base of the brain. Major venous sinuses show flow. IMPRESSION: Normal examination. No finding to explain the clinical presentation. Electronically Signed   By: Nelson Chimes M.D.   On: 09/16/2015 20:30   2-D echo LV EF: 55% - 60%  ------------------------------------------------------------------- Indications: Dyspnea  786.09.  ------------------------------------------------------------------- History: PMH: Tachycardia. Risk factors: Hypertension.  ------------------------------------------------------------------- Study Conclusions  - Left ventricle: The cavity size was normal. Systolic function was  normal. The estimated ejection fraction was in the range of 55%  to 60%. Wall motion was normal; there were no regional wall  motion abnormalities. There was an increased relative  contribution of atrial contraction to ventricular filling.  Doppler parameters are consistent with abnormal left ventricular  relaxation (grade 1 diastolic dysfunction). - Aortic valve: Valve area (VTI): 3.09 cm^2. Valve area (Vmax):  3.05 cm^2. Valve area (Vmean): 2.83 cm^2. - Pulmonic valve: There was trivial regurgitation.   There were no vitals filed for this visit.   Microbiology: Recent Results (from the past 240 hour(s))  CSF culture  Status: None (Preliminary result)   Collection Time: 09/16/15 12:57 PM  Result Value Ref Range Status   Specimen Description CSF  Final   Special Requests NONE  Final   Gram Stain NO WBC SEEN NO ORGANISMS SEEN CYTOSPIN SMEAR   Final   Culture NO GROWTH 1 DAY  Final   Report Status PENDING  Incomplete  Culture, blood (Routine X 2) w Reflex to ID Panel     Status: None (Preliminary result)   Collection Time: 09/16/15  3:46 PM  Result Value Ref Range Status   Specimen Description BLOOD RIGHT HAND  Final   Special Requests BOTTLES DRAWN AEROBIC AND ANAEROBIC 5CC  Final   Culture NO GROWTH < 24 HOURS  Final   Report Status PENDING  Incomplete  Culture, blood (Routine X 2) w Reflex to ID Panel     Status: None (Preliminary result)   Collection Time: 09/16/15  3:52 PM  Result Value Ref Range Status   Specimen Description BLOOD LEFT HAND  Final   Special Requests BOTTLES DRAWN AEROBIC AND ANAEROBIC 5CC  Final   Culture NO GROWTH < 24 HOURS  Final   Report  Status PENDING  Incomplete       Blood Culture    Component Value Date/Time   SDES BLOOD LEFT HAND 09/16/2015 1552   SPECREQUEST BOTTLES DRAWN AEROBIC AND ANAEROBIC 5CC 09/16/2015 1552   CULT NO GROWTH < 24 HOURS 09/16/2015 1552   REPTSTATUS PENDING 09/16/2015 1552      Labs: Results for orders placed or performed during the hospital encounter of 09/16/15 (from the past 48 hour(s))  Culture, blood (Routine X 2) w Reflex to ID Panel     Status: None (Preliminary result)   Collection Time: 09/16/15  3:46 PM  Result Value Ref Range   Specimen Description BLOOD RIGHT HAND    Special Requests BOTTLES DRAWN AEROBIC AND ANAEROBIC 5CC    Culture NO GROWTH < 24 HOURS    Report Status PENDING   I-Stat arterial blood gas, ED     Status: None   Collection Time: 09/16/15  3:48 PM  Result Value Ref Range   pH, Arterial 7.437 7.350 - 7.450   pCO2 arterial 35.3 35.0 - 45.0 mmHg   pO2, Arterial 82.0 80.0 - 100.0 mmHg   Bicarbonate 23.8 20.0 - 24.0 mEq/L   TCO2 25 0 - 100 mmol/L   O2 Saturation 96.0 %   Patient temperature HIDE    Sample type ARTERIAL   Culture, blood (Routine X 2) w Reflex to ID Panel     Status: None (Preliminary result)   Collection Time: 09/16/15  3:52 PM  Result Value Ref Range   Specimen Description BLOOD LEFT HAND    Special Requests BOTTLES DRAWN AEROBIC AND ANAEROBIC 5CC    Culture NO GROWTH < 24 HOURS    Report Status PENDING   Ethanol     Status: None   Collection Time: 09/16/15  3:54 PM  Result Value Ref Range   Alcohol, Ethyl (B) <5 <5 mg/dL    Comment:        LOWEST DETECTABLE LIMIT FOR SERUM ALCOHOL IS 5 mg/dL FOR MEDICAL PURPOSES ONLY   Acetaminophen level     Status: Abnormal   Collection Time: 09/16/15  3:54 PM  Result Value Ref Range   Acetaminophen (Tylenol), Serum <10 (L) 10 - 30 ug/mL    Comment:        THERAPEUTIC CONCENTRATIONS VARY SIGNIFICANTLY. A RANGE OF 10-30  ug/mL MAY BE AN EFFECTIVE CONCENTRATION FOR MANY PATIENTS. HOWEVER,  SOME ARE BEST TREATED AT CONCENTRATIONS OUTSIDE THIS RANGE. ACETAMINOPHEN CONCENTRATIONS >150 ug/mL AT 4 HOURS AFTER INGESTION AND >50 ug/mL AT 12 HOURS AFTER INGESTION ARE OFTEN ASSOCIATED WITH TOXIC REACTIONS.   Salicylate level     Status: None   Collection Time: 09/16/15  3:54 PM  Result Value Ref Range   Salicylate Lvl <1.1 2.8 - 30.0 mg/dL  Brain natriuretic peptide     Status: None   Collection Time: 09/16/15  3:54 PM  Result Value Ref Range   B Natriuretic Peptide 38.9 0.0 - 100.0 pg/mL  Lactic acid, plasma     Status: Abnormal   Collection Time: 09/16/15  3:58 PM  Result Value Ref Range   Lactic Acid, Venous 2.3 (HH) 0.5 - 2.0 mmol/L    Comment: CRITICAL RESULT CALLED TO, READ BACK BY AND VERIFIED WITH: C PRICE,RN [id number] 09/16/15 D BRADLEY   Procalcitonin - Baseline     Status: None   Collection Time: 09/16/15  4:07 PM  Result Value Ref Range   Procalcitonin <0.10 ng/mL    Comment:        Interpretation: PCT (Procalcitonin) <= 0.5 ng/mL: Systemic infection (sepsis) is not likely. Local bacterial infection is possible. (NOTE)         ICU PCT Algorithm               Non ICU PCT Algorithm    ----------------------------     ------------------------------         PCT < 0.25 ng/mL                 PCT < 0.1 ng/mL     Stopping of antibiotics            Stopping of antibiotics       strongly encouraged.               strongly encouraged.    ----------------------------     ------------------------------       PCT level decrease by               PCT < 0.25 ng/mL       >= 80% from peak PCT       OR PCT 0.25 - 0.5 ng/mL          Stopping of antibiotics                                             encouraged.     Stopping of antibiotics           encouraged.    ----------------------------     ------------------------------       PCT level decrease by              PCT >= 0.25 ng/mL       < 80% from peak PCT        AND PCT >= 0.5 ng/mL            Continuin g antibiotics                                               encouraged.       Continuing antibiotics  encouraged.    ----------------------------     ------------------------------     PCT level increase compared          PCT > 0.5 ng/mL         with peak PCT AND          PCT >= 0.5 ng/mL             Escalation of antibiotics                                          strongly encouraged.      Escalation of antibiotics        strongly encouraged.   TSH     Status: Abnormal   Collection Time: 09/16/15  5:59 PM  Result Value Ref Range   TSH 0.167 (L) 0.350 - 4.500 uIU/mL  Comprehensive metabolic panel     Status: Abnormal   Collection Time: 09/17/15  5:00 AM  Result Value Ref Range   Sodium 137 135 - 145 mmol/L   Potassium 3.1 (L) 3.5 - 5.1 mmol/L    Comment: DELTA CHECK NOTED   Chloride 104 101 - 111 mmol/L   CO2 25 22 - 32 mmol/L   Glucose, Bld 101 (H) 65 - 99 mg/dL   BUN 14 6 - 20 mg/dL   Creatinine, Ser 0.46 0.44 - 1.00 mg/dL   Calcium 8.7 (L) 8.9 - 10.3 mg/dL   Total Protein 5.0 (L) 6.5 - 8.1 g/dL   Albumin 3.0 (L) 3.5 - 5.0 g/dL   AST 17 15 - 41 U/L   ALT 32 14 - 54 U/L   Alkaline Phosphatase 50 38 - 126 U/L   Total Bilirubin 0.9 0.3 - 1.2 mg/dL   GFR calc non Af Amer >60 >60 mL/min   GFR calc Af Amer >60 >60 mL/min    Comment: (NOTE) The eGFR has been calculated using the CKD EPI equation. This calculation has not been validated in all clinical situations. eGFR's persistently <60 mL/min signify possible Chronic Kidney Disease.    Anion gap 8 5 - 15  CBC     Status: Abnormal   Collection Time: 09/17/15  5:00 AM  Result Value Ref Range   WBC 12.6 (H) 4.0 - 10.5 K/uL   RBC 4.17 3.87 - 5.11 MIL/uL   Hemoglobin 13.7 12.0 - 15.0 g/dL   HCT 40.7 36.0 - 46.0 %   MCV 97.6 78.0 - 100.0 fL   MCH 32.9 26.0 - 34.0 pg   MCHC 33.7 30.0 - 36.0 g/dL   RDW 12.8 11.5 - 15.5 %   Platelets 144 (L) 150 - 400 K/uL  T4, free     Status: None   Collection Time: 09/17/15 12:06 PM  Result  Value Ref Range   Free T4 0.90 0.61 - 1.12 ng/dL    Comment: (NOTE) Biotin ingestion may interfere with free T4 tests. If the results are inconsistent with the TSH level, previous test results, or the clinical presentation, then consider biotin interference. If needed, order repeat testing after stopping biotin.   Procalcitonin     Status: None   Collection Time: 09/18/15  4:28 AM  Result Value Ref Range   Procalcitonin <0.10 ng/mL    Comment:        Interpretation: PCT (Procalcitonin) <= 0.5 ng/mL: Systemic infection (sepsis) is not likely. Local bacterial infection is possible. (NOTE)  ICU PCT Algorithm               Non ICU PCT Algorithm    ----------------------------     ------------------------------         PCT < 0.25 ng/mL                 PCT < 0.1 ng/mL     Stopping of antibiotics            Stopping of antibiotics       strongly encouraged.               strongly encouraged.    ----------------------------     ------------------------------       PCT level decrease by               PCT < 0.25 ng/mL       >= 80% from peak PCT       OR PCT 0.25 - 0.5 ng/mL          Stopping of antibiotics                                             encouraged.     Stopping of antibiotics           encouraged.    ----------------------------     ------------------------------       PCT level decrease by              PCT >= 0.25 ng/mL       < 80% from peak PCT        AND PCT >= 0.5 ng/mL            Continuin g antibiotics                                              encouraged.       Continuing antibiotics            encouraged.    ----------------------------     ------------------------------     PCT level increase compared          PCT > 0.5 ng/mL         with peak PCT AND          PCT >= 0.5 ng/mL             Escalation of antibiotics                                          strongly encouraged.      Escalation of antibiotics        strongly encouraged.   CBC     Status:  Abnormal   Collection Time: 09/18/15  4:28 AM  Result Value Ref Range   WBC 11.3 (H) 4.0 - 10.5 K/uL   RBC 4.08 3.87 - 5.11 MIL/uL   Hemoglobin 13.3 12.0 - 15.0 g/dL   HCT 39.5 36.0 - 46.0 %   MCV 96.8 78.0 - 100.0 fL   MCH 32.6 26.0 - 34.0 pg   MCHC 33.7 30.0 - 36.0 g/dL   RDW 12.6 11.5 - 15.5 %  Platelets 161 150 - 400 K/uL  Comprehensive metabolic panel     Status: Abnormal   Collection Time: 09/18/15  4:28 AM  Result Value Ref Range   Sodium 138 135 - 145 mmol/L   Potassium 3.7 3.5 - 5.1 mmol/L   Chloride 105 101 - 111 mmol/L   CO2 26 22 - 32 mmol/L   Glucose, Bld 107 (H) 65 - 99 mg/dL   BUN 9 6 - 20 mg/dL   Creatinine, Ser 0.44 0.44 - 1.00 mg/dL   Calcium 9.1 8.9 - 10.3 mg/dL   Total Protein 5.1 (L) 6.5 - 8.1 g/dL   Albumin 2.8 (L) 3.5 - 5.0 g/dL   AST 13 (L) 15 - 41 U/L   ALT 28 14 - 54 U/L   Alkaline Phosphatase 45 38 - 126 U/L   Total Bilirubin 0.8 0.3 - 1.2 mg/dL   GFR calc non Af Amer >60 >60 mL/min   GFR calc Af Amer >60 >60 mL/min    Comment: (NOTE) The eGFR has been calculated using the CKD EPI equation. This calculation has not been validated in all clinical situations. eGFR's persistently <60 mL/min signify possible Chronic Kidney Disease.    Anion gap 7 5 - 15     Lipid Panel  No results found for: CHOL, TRIG, HDL, CHOLHDL, VLDL, LDLCALC, LDLDIRECT   No results found for: HGBA1C   Lab Results  Component Value Date   CREATININE 0.44 09/18/2015     40 y.o. female with medical history significant for severe persistent allergic asthma, steroid dependent. She is typically followed at [facility] by pulmonology. She also has issues with endometriosis and interstitial cystitis on twice a day bladder irrigations via I/O cath with a solution of Marcaine, bicarbonate and heparin. She's had repeated hospitalizations for asthma exacerbations and most recently has not transitioned well from Solu-Medrol to prednisone prompting placement of a PICC line  with initiation of Solu-Medrol 60 mg IV every 12 hours on 5/31 by pulmonologist Dr Laurance Flatten .Head CT was unremarkable. Neurology was suspicious patient could have steroid-induced mental status changes versus opportunistic infection versus seizure. EEG was recommended and tentative report is abnormal waveforms in the frontal lobe. MRI of the brain was also recommended. LP was done and did not show any evidence of infection or meningitis. Neurology/pulmonary were consulted  Assessment and plan  Acute drug-induced encephalopathy secondary to steroids -Patient presents with encephalopathy that began after initiation of relatively high dose IV steroids in the home environment -Differential includes: Steroid induced mental status changes/psychoses, opportunistic infection in setting of recurrent use of steroids, seizure activity and less likely stroke EEG shows frontally predominant irregular delta activity consistent with generalized nonspecific cerebral dysfunction MRI of the brain negative, Lumbar puncture not consistent with meningitis  no medical signs of infection present,Pro calcitonin negative Possibilities include steroid-induced mental status changes, opportunistic infection, seizure, less likely to be stroke.,      Severe persistent asthma/Steroid-dependent asthma -Followed by pulmonology at [facility] resolution CT scan of the chest obtained 4/25 revealed extensive atelectasis and or post infectious/inflammatory scarring throughout the lung bases bilaterally predominantly in the lower lobes but no typical stigmata that would be suggestive of interstitial lung disease -ABG here unremarkable -A1 antitrypsin serologies negative -IgE serology negative/within normal limits -PFTs performed in April of this year within normal limits --Per recommendation from neurology had decreased Solu-Medrol to 40 mg IV every 12 hours given concerns of steroid induced mental status changes, start slowly  tapering steroids,   -  Patient on multiple MDIs and nebulizers at home and these have been reordered -She has never been on PPI to cover for possible reflux/vocal cord dysfunction- started on PPI 2-D echo shows grade 1 diastolic dysfunction however patient did not have any fluid overload or exacerbation at this point However may benefit from low-dose Lasix in the future    Leukocytosis -Likely related to steroid use but as precaution and infectious workup has been initiated -Since no clearly identifiable source of infection and no constitutional symptoms and patient otherwise hemodynamically stable no indication to initiate empiric antibiotics at this juncture   Essential hypertension -Continue preadmission Cozaar and Norvasc   Interstitial cystitis Did not flareup during this hospitalization   Physical deconditioning -PT/ OT evaluation this admission -recommend SNF -Concern with frequent use of steroids patient may be developing steroid-induced myopathy   Oral thrush --Reports that patient has "really bad thrush" but tongue appears normal but unable to visualize posterior pharynx   Hypokalemia-found to have a potassium of 3.1 which was repleted.  Low TSH- TSF 0.167, free T4 0.9  Discharge Exam: *   Blood pressure 144/95, pulse 95, temperature 97.3 F (36.3 C), temperature source Oral, resp. rate 16, SpO2 96 %.   General exam: Appears calm and comfortable  Respiratory system: Clear to auscultation. Respiratory effort normal. Cardiovascular system: S1 & S2 heard, RRR. No JVD, murmurs, rubs, gallops or clicks. No pedal edema. Gastrointestinal system: Abdomen is nondistended, soft and nontender. No organomegaly or masses felt. Normal bowel sounds heard. Central nervous system: Alert and oriented. No focal neurological deficits. Extremities: Symmetric 5 x 5 power. Skin: No rashes, lesions or ulcers Psychiatry: Judgement and insight appear normal. Mood & affect appropriate.     Follow-up Information    Follow up with Simona Huh, MD. Schedule an appointment as soon as possible for a visit in 3 days.   Specialty:  Family Medicine   Why:  Hospital follow-up   Contact information:   [address] [place] [address] [phone]       Follow up with Pulmonary. Schedule an appointment as soon as possible for a visit in 3 days.   Why:  Hospital follow-up   Contact information:    Pulmonary & [place], [place] [address]  [phone]  [facility], [facility] [place], [place] [address]  [phone]  [phone] (Fax)         Signed: Reyne Dumas 09/18/2015, 1:08 PM        Time spent >45 mins

## 2015-09-18 NOTE — Progress Notes (Signed)
Occupational Therapy Treatment Patient Details Name: Mikaylah Libbey MRN: [id number] DOB: Nov 21, 1975 Today's Date: 09/18/2015    History of present illness 40 y.o. female with medical history significant for severe persistent allergic asthma, steroid dependent. She is typically followed at [facility] by pulmonology. She also has issues with endometriosis and interstitial cystitis on twice a day bladder irrigations via I/O cath with a solution of Marcaine, bicarbonate and heparin. She's had repeated hospitalizations for asthma exacerbations and most recently has not transitioned well from Solu-Medrol to prednisone prompting placement of a PICC line with initiation of Solu-Medrol 60 mg IV every 12 hours on 5/31.Head CT was unremarkable. Neurology was suspicious patient could have steroid-induced mental status changes versus opportunistic infection versus seizure. EEG was recommended and tentative report is abnormal waveforms in the frontal lobe. MRI of the brain was also recommended. LP was done and did not show any evidence of infection or meningitis. Neurology/pulmonary were consulted   OT comments  Flat affect. Mother states "to be honest, she gets in these funks, even before she came into the hospital, but it has never been this bad". Pt ambulated to bathroom and required cues to complete hygiene. Pt stood at sink for more than 1 minute going through the movement pattern of washing hands without any water running. Once given cue to turn on water, pt completed task then impulsively got into bed almost pulling out her IV - declined further ambulation. Pt with apparent cognitive deficits. Recommend Psych consult. Will continue to follow.   Follow Up Recommendations  Home health OT;Supervision/Assistance - 24 hour    Equipment Recommendations  None recommended by OT    Recommendations for Other Services  Recommend Psych consult.     Precautions / Restrictions Precautions Precautions: Fall        Mobility Bed Mobility Overal bed mobility: Needs Assistance             General bed mobility comments: Supervision  Transfers Overall transfer level: Needs assistance   Transfers: Sit to/from Stand;Stand Pivot Transfers Sit to Stand: Supervision Stand pivot transfers: Min guard            Balance     Sitting balance-Leahy Scale: Good       Standing balance-Leahy Scale: Fair                     ADL Overall ADL's : Needs assistance/impaired                                     Functional mobility during ADLs: Min guard General ADL Comments: Would only participate in going to the bathroom. Attempted to ambulate pt however, pt walked quickly to bed and impulsively "laid down with gait belt on and almost pulling out her IV      Vision                     Perception     Praxis      Cognition   Behavior During Therapy: Restless;Flat affect Overall Cognitive Status: Impaired/Different from baseline Area of Impairment: Orientation;Attention;Memory;Following commands;Safety/judgement;Awareness;Problem solving Orientation Level: Disoriented to;Place;Time;Situation Current Attention Level: Sustained Memory: Decreased short-term memory  Following Commands: Follows one step commands with increased time Safety/Judgement: Decreased awareness of safety;Decreased awareness of deficits Awareness: Intellectual Problem Solving: Slow processing;Decreased initiation;Requires verbal cues General Comments: Pt able to complete toileting task but requried cues to comlete  hygiene. While washing hands at sink, pt put soap on hands but contiued to rub hands under faucet for @ 1 min without water running. when given cue to turn water on, she forcible grabbed handle and turned water on full blast while starring at therapist. Requred cue to turn off water, then repeatedly grabbed paper towels until told to stop.     Extremity/Trunk Assessment                Exercises     Shoulder Instructions       General Comments      Pertinent Vitals/ Pain       Pain Assessment: Faces Faces Pain Scale: Hurts a little bit Pain Location: "everything hurts" Pain Intervention(s): Limited activity within patient's tolerance  Home Living                                          Prior Functioning/Environment              Frequency Min 2X/week     Progress Toward Goals  OT Goals(current goals can now be found in the care plan section)  Progress towards OT goals: Progressing toward goals  Acute Rehab OT Goals Patient Stated Goal: none stated; familys goal is to figure out what is going on with pt OT Goal Formulation: With family Time For Goal Achievement: 10/01/15 Potential to Achieve Goals: Good ADL Goals Pt Will Perform Grooming: with modified independence;standing Pt Will Perform Upper Body Bathing: with modified independence;sitting;standing Pt Will Perform Lower Body Bathing: with modified independence;sit to/from stand Pt Will Transfer to Toilet: with modified independence;ambulating;regular height toilet Pt Will Perform Toileting - Clothing Manipulation and hygiene: with modified independence;sit to/from stand  Plan Discharge plan remains appropriate    Co-evaluation                 End of Session Equipment Utilized During Treatment: Gait belt   Activity Tolerance Patient tolerated treatment well   Patient Left in bed;with call bell/phone within reach;with family/visitor present   Nurse Communication Mobility status        Time: [id number] OT Time Calculation (min): 15 min  Charges: OT General Charges $OT Visit: 1 Procedure OT Treatments $Self Care/Home Management : 8-22 mins  Quincey Quesinberry,HILLARY 09/18/2015, 10:42 AM   Luisa DagoHilary Alton Tremblay, OTR/L  [phone] 09/18/2015

## 2015-09-20 LAB — CSF CULTURE W GRAM STAIN: Gram Stain: NONE SEEN

## 2015-09-20 LAB — CSF CULTURE: CULTURE: NO GROWTH

## 2015-09-21 LAB — CULTURE, BLOOD (ROUTINE X 2)
Culture: NO GROWTH
Culture: NO GROWTH

## 2016-04-16 ENCOUNTER — Other Ambulatory Visit: Payer: Self-pay | Admitting: Obstetrics and Gynecology

## 2016-04-16 DIAGNOSIS — Z1231 Encounter for screening mammogram for malignant neoplasm of breast: Secondary | ICD-10-CM

## 2016-05-24 ENCOUNTER — Ambulatory Visit
Admission: RE | Admit: 2016-05-24 | Discharge: 2016-05-24 | Disposition: A | Discharge status: Home / Self Care | Payer: Medicare Other | Source: Ambulatory Visit | Attending: Obstetrics and Gynecology | Admitting: Obstetrics and Gynecology

## 2016-05-24 ENCOUNTER — Ambulatory Visit: Payer: BLUE CROSS/BLUE SHIELD

## 2016-05-24 DIAGNOSIS — Z1231 Encounter for screening mammogram for malignant neoplasm of breast: Secondary | ICD-10-CM

## 2017-03-25 ENCOUNTER — Other Ambulatory Visit: Payer: Self-pay | Admitting: Family Medicine

## 2017-04-16 ENCOUNTER — Other Ambulatory Visit: Payer: Self-pay | Admitting: Obstetrics and Gynecology

## 2017-04-16 DIAGNOSIS — Z1231 Encounter for screening mammogram for malignant neoplasm of breast: Secondary | ICD-10-CM

## 2017-05-27 ENCOUNTER — Ambulatory Visit: Payer: Medicare Other

## 2017-05-29 ENCOUNTER — Ambulatory Visit: Payer: Medicare Other

## 2017-05-30 ENCOUNTER — Ambulatory Visit
Admission: RE | Admit: 2017-05-30 | Discharge: 2017-05-30 | Disposition: A | Discharge status: Home / Self Care | Payer: Medicare Other | Source: Ambulatory Visit | Attending: Obstetrics and Gynecology | Admitting: Obstetrics and Gynecology

## 2017-05-30 DIAGNOSIS — Z1231 Encounter for screening mammogram for malignant neoplasm of breast: Secondary | ICD-10-CM

## 2017-06-14 ENCOUNTER — Encounter (HOSPITAL_COMMUNITY): Payer: Self-pay

## 2017-06-14 ENCOUNTER — Other Ambulatory Visit: Payer: Self-pay

## 2017-06-14 ENCOUNTER — Emergency Department (HOSPITAL_COMMUNITY)
Admission: EM | Admit: 2017-06-14 | Discharge: 2017-06-15 | Disposition: A | Discharge status: Home / Self Care | Payer: Medicare Other | Attending: Emergency Medicine | Admitting: Emergency Medicine

## 2017-06-14 DIAGNOSIS — M7981 Nontraumatic hematoma of soft tissue: Secondary | ICD-10-CM | POA: Insufficient documentation

## 2017-06-14 DIAGNOSIS — Z79899 Other long term (current) drug therapy: Secondary | ICD-10-CM | POA: Diagnosis not present

## 2017-06-14 DIAGNOSIS — T148XXA Other injury of unspecified body region, initial encounter: Secondary | ICD-10-CM

## 2017-06-14 DIAGNOSIS — I10 Essential (primary) hypertension: Secondary | ICD-10-CM | POA: Insufficient documentation

## 2017-06-14 DIAGNOSIS — M25531 Pain in right wrist: Secondary | ICD-10-CM | POA: Diagnosis present

## 2017-06-15 LAB — CBC WITH DIFFERENTIAL/PLATELET
BASOS ABS: 0 10*3/uL (ref 0.0–0.1)
BASOS PCT: 0 %
Eosinophils Absolute: 0.1 10*3/uL (ref 0.0–0.7)
Eosinophils Relative: 1 %
HEMATOCRIT: 44.5 % (ref 36.0–46.0)
HEMOGLOBIN: 15.1 g/dL — AB (ref 12.0–15.0)
Lymphocytes Relative: 15 %
Lymphs Abs: 2.2 10*3/uL (ref 0.7–4.0)
MCH: 33.9 pg (ref 26.0–34.0)
MCHC: 33.9 g/dL (ref 30.0–36.0)
MCV: 100 fL (ref 78.0–100.0)
Monocytes Absolute: 0.9 10*3/uL (ref 0.1–1.0)
Monocytes Relative: 6 %
Neutro Abs: 10.9 10*3/uL — ABNORMAL HIGH (ref 1.7–7.7)
Neutrophils Relative %: 78 %
Platelets: 424 10*3/uL — ABNORMAL HIGH (ref 150–400)
RBC: 4.45 MIL/uL (ref 3.87–5.11)
RDW: 12.9 % (ref 11.5–15.5)
WBC: 14.2 10*3/uL — AB (ref 4.0–10.5)

## 2017-06-15 NOTE — ED Triage Notes (Signed)
Pt is c/o right wrist pain with associated bruising, concerned that since she takes lots of steroids she wanted to be cautious r/o blood clots.

## 2017-06-15 NOTE — ED Provider Notes (Signed)
[facility]-EMERGENCY DEPT Provider Note   CSN: [id number] Arrival date & time: 06/14/17  2153     History   Chief Complaint Chief Complaint  Patient presents with  . Wrist Pain    Right    HPI Marissa Gibson is a 42 y.o. female.  The history is provided by the patient and medical records.  Wrist Pain      42 year old female with history of asthma, endometriosis, ovarian cysts, hypertension, presenting to the ED with bruise of the right volar wrist.  States she was out to dinner and went down to her wrist and noticed that it was bruised.  She does not remember striking her wrist or hand on anything.  She reported a little bit of tingling in her right thumb but that is resolved at this time.  States she became concerned as she has never developed bruising without any injury in the past.  She does take chronic steroids and her skin is very thin so she does bruise often but usually with explainable cause.  She recently started taking Dupixent for her asthma and was concerned this may be contributing.  Her brother was also recently diagnosed with hemochromatosis, she is not been tested for this.  Past Medical History:  Diagnosis Date  . Asthma   . Cystitis   . Endometriosis   . Ovarian cyst     Patient Active Problem List   Diagnosis Date Noted  . Encephalopathy acute 09/17/2015  . Altered mental status   . Acute encephalopathy 09/16/2015  . Physical deconditioning 09/16/2015  . Oral thrush 09/16/2015  . Severe persistent asthma 09/16/2015  . 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 Stridor 08/01/2015  . Leukocytosis 08/01/2015  . Sinus tachycardia 08/01/2015  . Hypokalemia 08/01/2015  . Interstitial cystitis 08/01/2015  . Essential hypertension 08/01/2015  . SIRS (systemic inflammatory response syndrome) (HCC) 08/01/2015  . Asthma exacerbation 07/31/2015    Past Surgical History:  Procedure Laterality Date  . ABLATION ON ENDOMETRIOSIS    .  APPENDECTOMY    . CHOLECYSTECTOMY    . HERNIA REPAIR    . KNEE SURGERY    . OVARIAN CYST REMOVAL      OB History    No data available       Home Medications    Prior to Admission medications   Medication Sig Start Date End Date Taking? Authorizing Provider  acetaminophen (TYLENOL) 325 MG tablet Take 650 mg by mouth every 6 (six) hours as needed for mild pain.    [provider]  albuterol (PROVENTIL HFA;VENTOLIN HFA) 108 (90 BASE) MCG/ACT inhaler Inhale 1 puff into the lungs every 6 (six) hours as needed for wheezing or shortness of breath.    [provider]  albuterol (PROVENTIL) (2.5 MG/3ML) 0.083% nebulizer solution Take 2.5 mg by nebulization every 8 (eight) hours as needed for wheezing or shortness of breath.    [provider]  amLODipine (NORVASC) 5 MG tablet Take 5 mg by mouth daily.    [provider]  B Complex-C (SUPER B COMPLEX/VITAMIN C PO) Take 1 tablet by mouth daily.    [provider]  beclomethasone (QVAR) 80 MCG/ACT inhaler Inhale 2 puffs into the lungs 3 (three) times daily.    [provider]  Beclomethasone Dipropionate (QNASL) 80 MCG/ACT AERS Place 1 spray into the nose 2 (two) times daily.     [provider]  bupivacaine (MARCAINE) 0.5 % SOLN injection 10 mLs by Infiltration route See  admin instructions. 2-3 times per day combined with Sodium Bicarbonate for bladder instillation    [provider]  calcium-vitamin D (OSCAL WITH D) 500-200 MG-UNIT per tablet Take 1 tablet by mouth 2 (two) times daily.     [provider]  cholestyramine (QUESTRAN) 4 g packet Take 4 g by mouth 2 (two) times daily.    [provider]  CRANBERRY EXTRACT PO Take 1 tablet by mouth daily.     [provider]  cycloSPORINE (RESTASIS) 0.05 % ophthalmic emulsion Place 2 drops into both eyes 2 (two) times daily.    [provider]  EPINEPHrine (EPIPEN 2-PAK) 0.3 mg/0.3 mL IJ SOAJ  injection Inject 0.3 mg into the muscle as needed (for anaphylaxis).    [provider]  estradiol (CLIMARA - DOSED IN MG/24 HR) 0.0375 mg/24hr patch Place 0.0375 mg onto the skin every Wednesday.     [provider]  FIBER PO Take 2 tablets by mouth daily.    [provider]  furosemide (LASIX) 20 MG tablet Take 1 tablet (20 mg total) by mouth daily. 09/18/15   Richarda Overlie, MD  glucosamine-chondroitin (COSAMIN DS) 500-400 MG tablet Take 1 tablet by mouth daily.    [provider]  guaiFENesin (MUCINEX) 600 MG 12 hr tablet Take 600 mg by mouth 2 (two) times daily as needed for cough.    [provider]  HYDROcodone-acetaminophen (NORCO/VICODIN) 5-325 MG tablet Take 1 tablet by mouth every 6 (six) hours as needed for moderate pain.    [provider]  ipratropium (ATROVENT) 0.06 % nasal spray Place 1 spray into both nostrils 2 (two) times daily.     [provider]  Ipratropium-Albuterol (COMBIVENT RESPIMAT) 20-100 MCG/ACT AERS respimat Inhale 1 puff into the lungs every 6 (six) hours as needed for wheezing or shortness of breath.    [provider]  levocetirizine (XYZAL) 5 MG tablet Take 5 mg by mouth every morning.     [provider]  levonorgestrel (MIRENA) 20 MCG/24HR IUD 1 each by Intrauterine route once.    [provider]  losartan (COZAAR) 50 MG tablet Take 50 mg by mouth daily.    [provider]  magnesium oxide (MAG-OX) 400 MG tablet Take 400 mg by mouth daily.    [provider]  Methen-Hyosc-Meth Blue-Na Phos (UROGESIC-BLUE) 81.6 MG TABS Take 1 tablet by mouth See admin instructions. Takes 2-3 times daily    [provider]  methylPREDNISolone sodium succinate (SOLU-MEDROL) 40 mg/mL injection Inject 1 mL (40 mg total) into the vein every 12 (twelve) hours. 09/18/15   Richarda Overlie, MD  Multiple Vitamin (MULTIVITAMIN WITH MINERALS) TABS tablet Take 1 tablet by mouth daily.     [provider]  pentosan polysulfate (ELMIRON) 100 MG capsule Take 100 mg by mouth 3 (three) times daily.    [provider]  potassium chloride (K-DUR) 10 MEQ tablet Take 1 tablet (10 mEq total) by mouth daily. 09/18/15   Richarda Overlie, MD  Probiotic Product (PROBIOTIC FORMULA) CAPS Take 1 capsule by mouth daily.    [provider]  terconazole (TERAZOL 7) 0.4 % vaginal cream Place 1 applicator vaginally at bedtime. Started 4/23 for 7 days ending 4/30    [provider]  Tiotropium Bromide-Olodaterol (STIOLTO RESPIMAT) 2.5-2.5 MCG/ACT AERS Inhale 1 puff into the lungs 2 (two) times daily.    [provider]  zileuton (ZYFLO CR) 600 MG CR tablet Take 1,200 mg by mouth 2 (two)  times daily.    [provider]    Family History Family History  Problem Relation Age of Onset  . Breast cancer Paternal Aunt 2    Social History Social History   Tobacco Use  . Smoking status: Never Smoker  . Smokeless tobacco: Never Used  Substance Use Topics  . Alcohol use: Yes  . Drug use: No     Allergies   Allspice [pimenta]; Other; Xolair [omalizumab]; Amitriptyline; Duloxetine; Ibuprofen; Nsaids; Sudafed [pseudoephedrine hcl]; Tape; Benadryl [diphenhydramine]; Nucala [mepolizumab]; Ceclor [cefaclor]; Cymbalta [duloxetine hcl]; and Neomycin-bacitracin zn-polymyx   Review of Systems Review of Systems  Hematological: Bruises/bleeds easily.  All other systems reviewed and are negative.    Physical Exam Updated Vital Signs BP (!) 137/95 (BP Location: Right Arm)   Pulse 94   Temp 98.3 F (36.8 C) (Oral)   Resp 17   Ht 5\' 5"  (1.651 m)   Wt 71.7 kg (158 lb)   SpO2 100%   BMI 26.29 kg/m   Physical Exam  Constitutional: She is oriented to person, place, and time. She appears well-developed and well-nourished.  HENT:  Head: Normocephalic and atraumatic.  Mouth/Throat: Oropharynx is clear and moist.  Eyes: Conjunctivae and EOM are  normal. Pupils are equal, round, and reactive to light.  Neck: Normal range of motion.  Cardiovascular: Normal rate, regular rhythm and normal heart sounds.  Pulmonary/Chest: Effort normal and breath sounds normal. No stridor. No respiratory distress.  Abdominal: Soft. Bowel sounds are normal. There is no tenderness. There is no rebound.  Musculoskeletal: Normal range of motion.  superficial bruise of right radial wrist; this is locally tender; no bony deformity; full ROM maintained No diffuse swelling of the right arm, no palpable cords, overlying skin changes, or warmth to touch  Neurological: She is alert and oriented to person, place, and time.  Skin: Skin is warm and dry.  Psychiatric: She has a normal mood and affect.  Nursing note and vitals reviewed.    ED Treatments / Results  Labs (all labs ordered are listed, but only abnormal results are displayed) Labs Reviewed  CBC WITH DIFFERENTIAL/PLATELET    EKG  EKG Interpretation None       Radiology No results found.  Procedures Procedures (including critical care time)  Medications Ordered in ED Medications - No data to display   Initial Impression / Assessment and Plan / ED Course  I have reviewed the triage vital signs and the nursing notes.  Pertinent labs & imaging results that were available during my care of the patient were reviewed by me and considered in my medical decision making (see chart for details).  42 year old female presenting to the ED with atraumatic bruising of the right radial wrist.  Brother was recently diagnosed with hemochromatosis, she has no known bleeding disorder herself.  There are no signs or symptoms concerning for DVT of the RUE.  Did recently start new medication (Dupixent) but no bleeding issues noted on side effect profile.  Given familial history, CBC was obtained, platelets are normal.  WBC count elevated, however suspect this is from her chronic steroid use.  She has no  infectious symptoms.  Results discussed with patient, she feels reassured.  Recommended close follow-up with PCP.  She understands to return here for any new or worsening symptoms.  Final Clinical Impressions(s) / ED Diagnoses   Final diagnoses:  Bruising    ED Discharge Orders    None       Garlon Hatchet, PA-C  06/15/17 0437    Lorre Nick, MD 06/15/17 [phone]

## 2017-06-15 NOTE — Discharge Instructions (Signed)
Keep an eye on the bruising. Your blood count was normal. Follow-up with your doctor. Return here for any new/acute changes.

## 2017-09-14 ENCOUNTER — Emergency Department (HOSPITAL_COMMUNITY)
Admission: EM | Admit: 2017-09-14 | Discharge: 2017-09-14 | Disposition: A | Discharge status: Home / Self Care | Payer: Medicare Other | Attending: Emergency Medicine | Admitting: Emergency Medicine

## 2017-09-14 ENCOUNTER — Emergency Department (HOSPITAL_COMMUNITY): Payer: Medicare Other

## 2017-09-14 ENCOUNTER — Encounter (HOSPITAL_COMMUNITY): Payer: Self-pay | Admitting: Emergency Medicine

## 2017-09-14 DIAGNOSIS — Y998 Other external cause status: Secondary | ICD-10-CM | POA: Insufficient documentation

## 2017-09-14 DIAGNOSIS — J45909 Unspecified asthma, uncomplicated: Secondary | ICD-10-CM | POA: Diagnosis not present

## 2017-09-14 DIAGNOSIS — Z79899 Other long term (current) drug therapy: Secondary | ICD-10-CM | POA: Diagnosis not present

## 2017-09-14 DIAGNOSIS — Y9389 Activity, other specified: Secondary | ICD-10-CM | POA: Insufficient documentation

## 2017-09-14 DIAGNOSIS — Y929 Unspecified place or not applicable: Secondary | ICD-10-CM | POA: Insufficient documentation

## 2017-09-14 DIAGNOSIS — I10 Essential (primary) hypertension: Secondary | ICD-10-CM | POA: Insufficient documentation

## 2017-09-14 DIAGNOSIS — S46911A Strain of unspecified muscle, fascia and tendon at shoulder and upper arm level, right arm, initial encounter: Secondary | ICD-10-CM | POA: Insufficient documentation

## 2017-09-14 DIAGNOSIS — M545 Low back pain, unspecified: Secondary | ICD-10-CM

## 2017-09-14 DIAGNOSIS — R51 Headache: Secondary | ICD-10-CM | POA: Diagnosis not present

## 2017-09-14 DIAGNOSIS — S4991XA Unspecified injury of right shoulder and upper arm, initial encounter: Secondary | ICD-10-CM | POA: Diagnosis present

## 2017-09-14 HISTORY — DX: Essential (primary) hypertension: I10

## 2017-09-14 HISTORY — DX: Other specified disorders of bone density and structure, unspecified site: M85.80
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
## 2017-09-14 NOTE — Discharge Instructions (Addendum)
Take tylenol 3 times a day with meals.   You may try muscle creams to help with pain (bengay, salonpas, icy hot). Use ice packs or heating pads if this helps control your pain. You may do stretches and a massage for pain control.  You will likely have continued muscle stiffness and soreness over the next couple days.  Follow-up with primary care in 1 week if your symptoms are not improving. Return to the emergency room if you develop vision changes, vomiting, slurred speech, numbness, loss of bowel or bladder control, or any new or worsening symptoms.
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
## 2017-10-01 ENCOUNTER — Other Ambulatory Visit: Payer: Self-pay | Admitting: Physician Assistant

## 2017-10-01 ENCOUNTER — Ambulatory Visit
Admission: RE | Admit: 2017-10-01 | Discharge: 2017-10-01 | Disposition: A | Discharge status: Home / Self Care | Payer: Medicare Other | Source: Ambulatory Visit | Attending: Physician Assistant | Admitting: Physician Assistant

## 2017-10-01 DIAGNOSIS — M25562 Pain in left knee: Secondary | ICD-10-CM

## 2018-01-22 ENCOUNTER — Other Ambulatory Visit: Payer: Self-pay | Admitting: Obstetrics and Gynecology

## 2018-01-22 DIAGNOSIS — M8589 Other specified disorders of bone density and structure, multiple sites: Secondary | ICD-10-CM

## 2018-03-05 ENCOUNTER — Encounter (HOSPITAL_COMMUNITY): Payer: Self-pay | Admitting: Emergency Medicine

## 2018-03-05 ENCOUNTER — Emergency Department (HOSPITAL_COMMUNITY)
Admission: EM | Admit: 2018-03-05 | Discharge: 2018-03-05 | Disposition: A | Discharge status: Home / Self Care | Payer: Medicare Other | Attending: Emergency Medicine | Admitting: Emergency Medicine

## 2018-03-05 DIAGNOSIS — W5503XA Scratched by cat, initial encounter: Secondary | ICD-10-CM | POA: Diagnosis not present

## 2018-03-05 DIAGNOSIS — Y999 Unspecified external cause status: Secondary | ICD-10-CM | POA: Insufficient documentation

## 2018-03-05 DIAGNOSIS — Z79899 Other long term (current) drug therapy: Secondary | ICD-10-CM | POA: Insufficient documentation

## 2018-03-05 DIAGNOSIS — I10 Essential (primary) hypertension: Secondary | ICD-10-CM | POA: Insufficient documentation

## 2018-03-05 DIAGNOSIS — J45909 Unspecified asthma, uncomplicated: Secondary | ICD-10-CM | POA: Insufficient documentation

## 2018-03-05 DIAGNOSIS — S81811A Laceration without foreign body, right lower leg, initial encounter: Secondary | ICD-10-CM | POA: Diagnosis present

## 2018-03-05 DIAGNOSIS — Y9389 Activity, other specified: Secondary | ICD-10-CM | POA: Diagnosis not present

## 2018-03-05 DIAGNOSIS — Y92003 Bedroom of unspecified non-institutional (private) residence as the place of occurrence of the external cause: Secondary | ICD-10-CM | POA: Diagnosis not present

## 2018-03-05 MED ORDER — LIDOCAINE HCL (PF) 1 % IJ SOLN
INTRAMUSCULAR | Status: AC
  Filled 2018-03-05: qty 6

## 2018-03-05 MED ORDER — LIDOCAINE-EPINEPHRINE-TETRACAINE (LET) SOLUTION
3.0000 mL | Freq: Once | NASAL | Status: AC
  Administered 2018-03-05: 3 mL via TOPICAL
  Filled 2018-03-05: qty 3

## 2018-03-05 MED ORDER — OXYCODONE HCL 5 MG PO TABS: 5.0000 mg | ORAL_TABLET | Freq: Once | ORAL | Status: DC

## 2018-03-05 MED ORDER — AMOXICILLIN-POT CLAVULANATE 875-125 MG PO TABS
1.0000 | ORAL_TABLET | Freq: Once | ORAL | Status: AC
  Administered 2018-03-05: 1 via ORAL
  Filled 2018-03-05: qty 1

## 2018-03-05 MED ORDER — AMOXICILLIN-POT CLAVULANATE 875-125 MG PO TABS: 1.0000 | ORAL_TABLET | Freq: Two times a day (BID) | ORAL | 0 refills | Status: AC

## 2018-03-05 MED ORDER — HYDROCODONE-ACETAMINOPHEN 5-325 MG PO TABS: 1.0000 | ORAL_TABLET | Freq: Once | ORAL | Status: DC

## 2018-03-05 MED ORDER — LIDOCAINE HCL (PF) 1 % IJ SOLN
5.0000 mL | Freq: Once | INTRAMUSCULAR | Status: AC
  Administered 2018-03-05: 5 mL via INTRADERMAL
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
## 2018-03-05 NOTE — ED Provider Notes (Signed)
[facility] EMERGENCY DEPARTMENT Provider Note   CSN: [id number] Arrival date & time: 03/05/18  1478     History   Chief Complaint Chief Complaint  Patient presents with  . Laceration    HPI Marissa Gibson is a 42 y.o. female with history of chronic steroid use due to asthma presenting today for laceration of the right lower leg.  Patient states that injury occurred this morning just prior to arrival when her indoor cat slid off the bed and scratched her leg before falling off.  Patient states that the cat is up-to-date on all of its vaccinations including rabies.  Patient states that she had her tetanus updated earlier this year.  Patient describes her pain as a moderate intensity sharp sensation that is constant and worsened with palpation.  Patient does not take anything for pain prior to arrival.  Patient states that pain has improved since incident.  Bleeding was controlled with direct pressure by patient and her husband prior to arrival.  HPI  Past Medical History:  Diagnosis Date  . Asthma   . Cystitis   . Endometriosis   . Hypertension   . Osteopenia   . Ovarian cyst     Patient Active Problem List   Diagnosis Date Noted  . Encephalopathy acute 09/17/2015  . Altered mental status   . Acute encephalopathy 09/16/2015  . Physical deconditioning 09/16/2015  . Oral thrush 09/16/2015  . Severe persistent asthma 09/16/2015  . 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 Stridor 08/01/2015  . Leukocytosis 08/01/2015  . Sinus tachycardia 08/01/2015  . Hypokalemia 08/01/2015  . Interstitial cystitis 08/01/2015  . Essential hypertension 08/01/2015  . SIRS (systemic inflammatory response syndrome) (HCC) 08/01/2015  . Asthma exacerbation 07/31/2015    Past Surgical History:  Procedure Laterality Date  . ABLATION ON ENDOMETRIOSIS    . APPENDECTOMY    . CHOLECYSTECTOMY    . HERNIA REPAIR    . KNEE SURGERY    . OVARIAN CYST REMOVAL       OB History   None       Home Medications    Prior to Admission medications   Medication Sig Start Date End Date Taking? Authorizing Provider  acetaminophen (TYLENOL) 325 MG tablet Take 650 mg by mouth every 6 (six) hours as needed for mild pain.   Yes [provider]  albuterol (PROVENTIL HFA;VENTOLIN HFA) 108 (90 BASE) MCG/ACT inhaler Inhale 1 puff into the lungs every 6 (six) hours as needed for wheezing or shortness of breath.   Yes [provider]  albuterol (PROVENTIL) (2.5 MG/3ML) 0.083% nebulizer solution Take 2.5 mg by nebulization every 8 (eight) hours as needed for wheezing or shortness of breath.   Yes [provider]  amLODipine (NORVASC) 5 MG tablet Take 5 mg by mouth daily.   Yes [provider]  budesonide (RHINOCORT ALLERGY) 32 MCG/ACT nasal spray Place 1 spray into both nostrils daily.   Yes [provider]  calcium-vitamin D (OSCAL WITH D) 500-200 MG-UNIT per tablet Take 1 tablet by mouth 2 (two) times daily.    Yes [provider]  cholestyramine (QUESTRAN) 4 g packet Take 4 g by mouth 2 (two) times daily.   Yes [provider]  CRANBERRY EXTRACT PO Take 1 tablet by mouth daily.    Yes [provider]  fluticasone (FLOVENT HFA) 220 MCG/ACT inhaler Inhale 2 puffs into the lungs 2 (two) times daily.  09/29/17  Yes [provider]  amoxicillin-clavulanate (AUGMENTIN) 875-125 MG  tablet Take 1 tablet by mouth every 12 (twelve) hours for 10 days. 03/05/18 03/15/18  Harlene Salts A, PA-C  Beclomethasone Dipropionate (QNASL) 80 MCG/ACT AERS Place 1 spray into the nose 2 (two) times daily.     [provider]  bupivacaine (MARCAINE) 0.5 % SOLN injection 10 mLs by Infiltration route See admin instructions. 2-3 times per day combined with Sodium Bicarbonate for bladder instillation    [provider]  EPINEPHrine (EPIPEN 2-PAK) 0.3 mg/0.3 mL IJ SOAJ injection Inject 0.3 mg into the muscle as needed (for  anaphylaxis).    [provider]  estradiol (CLIMARA - DOSED IN MG/24 HR) 0.0375 mg/24hr patch Place 0.0375 mg onto the skin every Wednesday.     [provider]  FIBER PO Take 2 tablets by mouth daily.    [provider]  glucosamine-chondroitin (COSAMIN DS) 500-400 MG tablet Take 1 tablet by mouth daily.    [provider]  guaiFENesin (MUCINEX) 600 MG 12 hr tablet Take 600 mg by mouth 2 (two) times daily as needed for cough.    [provider]  HYDROcodone-acetaminophen (NORCO/VICODIN) 5-325 MG tablet Take 1 tablet by mouth every 6 (six) hours as needed for moderate pain.    [provider]  ipratropium (ATROVENT) 0.06 % nasal spray Place 1 spray into both nostrils 2 (two) times daily.     [provider]  levocetirizine (XYZAL) 5 MG tablet Take 5 mg by mouth every morning.     [provider]  levonorgestrel (MIRENA) 20 MCG/24HR IUD 1 each by Intrauterine route once.    [provider]  losartan (COZAAR) 50 MG tablet Take 50 mg by mouth daily.    [provider]  Methen-Hyosc-Meth Blue-Na Phos (UROGESIC-BLUE) 81.6 MG TABS Take 1 tablet by mouth See admin instructions. Takes 2-3 times daily    [provider]  methylPREDNISolone sodium succinate (SOLU-MEDROL) 40 mg/mL injection Inject 1 mL (40 mg total) into the vein every 12 (twelve) hours. 09/18/15   Richarda Overlie, MD  Multiple Vitamin (MULTIVITAMIN WITH MINERALS) TABS tablet Take 1 tablet by mouth daily.    [provider]  pentosan polysulfate (ELMIRON) 100 MG capsule Take 100 mg by mouth 3 (three) times daily.    [provider]  potassium chloride (K-DUR) 10 MEQ tablet Take 1 tablet (10 mEq total) by mouth daily. 09/18/15   Richarda Overlie, MD  Tiotropium Bromide-Olodaterol (STIOLTO RESPIMAT) 2.5-2.5 MCG/ACT AERS Inhale 1 puff into the lungs 2 (two) times daily.    [provider]  zileuton (ZYFLO CR) 600 MG CR tablet  Take 1,200 mg by mouth 2 (two) times daily.    [provider]    Family History Family History  Problem Relation Age of Onset  . Breast cancer Paternal Aunt 30    Social History Social History   Tobacco Use  . Smoking status: Never Smoker  . Smokeless tobacco: Never Used  Substance Use Topics  . Alcohol use: Yes  . Drug use: No     Allergies   Allspice [pimenta]; Other; Xolair [omalizumab]; Amitriptyline; Duloxetine; Ibuprofen; Nsaids; Sudafed [pseudoephedrine hcl]; Sudafed [pseudoephedrine]; Tape; Benadryl [diphenhydramine]; Nucala [mepolizumab]; Phenylephrine hcl; Phenylephrine-guaifenesin; Ceclor [cefaclor]; Cymbalta [duloxetine hcl]; and Neomycin-bacitracin zn-polymyx   Review of Systems Review of Systems  Constitutional: Negative.  Negative for chills and fever.  Musculoskeletal: Negative.  Negative for arthralgias and myalgias.  Skin: Positive for wound.  Neurological: Negative.  Negative for weakness and numbness.    Physical Exam  Updated Vital Signs BP (!) 146/84 (BP Location: Right Arm)   Pulse (!) 101   Temp 98.4 F (36.9 C) (Oral)   Resp 18   Ht 5\' 6"  (1.676 m)   Wt 72.6 kg   SpO2 98%   BMI 25.82 kg/m   Physical Exam  Constitutional: She appears well-developed and well-nourished. No distress.  HENT:  Head: Normocephalic and atraumatic.  Right Ear: External ear normal.  Left Ear: External ear normal.  Nose: Nose normal.  Eyes: Pupils are equal, round, and reactive to light. EOM are normal.  Neck: Trachea normal and normal range of motion. No tracheal deviation present.  Cardiovascular: Normal rate, regular rhythm, intact distal pulses and normal pulses.  Pulses:      Dorsalis pedis pulses are 2+ on the right side, and 2+ on the left side.       Posterior tibial pulses are 2+ on the right side, and 2+ on the left side.  Pulmonary/Chest: Effort normal. No respiratory distress.  Abdominal: Soft. There is no tenderness. There is no rebound  and no guarding.  Musculoskeletal: Normal range of motion.       Right knee: Normal.       Left knee: Normal.       Right ankle: Normal. Achilles tendon normal.       Left ankle: Normal.       Right lower leg: She exhibits tenderness and laceration. She exhibits no swelling and no deformity.       Left lower leg: Normal.       Right foot: Normal.       Left foot: Normal.  Feet:  Right Foot:  Protective Sensation: 3 sites tested. 3 sites sensed.  Left Foot:  Protective Sensation: 3 sites tested. 3 sites sensed.  Neurological: She is alert. No sensory deficit. GCS eye subscore is 4. GCS verbal subscore is 5. GCS motor subscore is 6.  Speech is clear and goal oriented, follows commands Major Cranial nerves without deficit, no facial droop Normal strength in upper and lower extremities bilaterally including dorsiflexion and plantar flexion, strong and equal grip strength Sensation normal to light touch Moves extremities without ataxia, coordination intact Normal gait  Skin: Skin is warm and dry. Capillary refill takes less than 2 seconds.  Patient with 5.5 cm curved gaping laceration to right lower leg with adipose tissue present. See picture below  Psychiatric: She has a normal mood and affect. Her behavior is normal.      ED Treatments / Results  Labs (all labs ordered are listed, but only abnormal results are displayed) Labs Reviewed - No data to display  EKG None  Radiology No results found.  Procedures .Marland Kitchen.Laceration Repair Date/Time: 03/05/2018 11:33 AM Performed by: Bill SalinasMorelli,  A, PA-C Authorized by: Bill SalinasMorelli,  A, PA-C   Consent:    Consent obtained:  Verbal   Consent given by:  Patient   Risks discussed:  Infection, pain, retained foreign body, tendon damage, vascular damage, poor wound healing, poor cosmetic result, nerve damage and need for additional repair Anesthesia (see MAR for exact dosages):    Anesthesia method:  Topical application and local  infiltration   Topical anesthetic:  LET   Local anesthetic:  Lidocaine 1% w/o epi Laceration details:    Location:  Leg   Leg location:  R lower leg   Length (cm):  5.5   Depth (mm):  3 Repair type:    Repair type:  Simple Pre-procedure details:  Preparation:  Patient was prepped and draped in usual sterile fashion Exploration:    Hemostasis achieved with:  Direct pressure   Wound exploration: wound explored through full range of motion and entire depth of wound probed and visualized     Wound extent: no foreign bodies/material noted, no muscle damage noted, no nerve damage noted, no tendon damage noted, no underlying fracture noted and no vascular damage noted     Contaminated: no   Treatment:    Area cleansed with:  Betadine and saline   Amount of cleaning:  Extensive   Irrigation solution:  Sterile saline   Irrigation volume:  1 Liter   Irrigation method:  Pressure wash Skin repair:    Repair method:  Sutures   Suture size:  4-0   Suture material:  Prolene   Suture technique:  Simple interrupted   Number of sutures:  5 Approximation:    Approximation:  Loose Post-procedure details:    Dressing:  Antibiotic ointment and sterile dressing   Patient tolerance of procedure:  Tolerated well, no immediate complications Comments:     Patient and husband endorses satisfaction with wound repair today.  They state understanding of why wound was loosely approximated instead of closely approximated.   (including critical care time)  Medications Ordered in ED Medications  amoxicillin-clavulanate (AUGMENTIN) 875-125 MG per tablet 1 tablet (1 tablet Oral Given 03/05/18 1025)  lidocaine-EPINEPHrine-tetracaine (LET) solution (3 mLs Topical Given 03/05/18 1026)     Initial Impression / Assessment and Plan / ED Course  I have reviewed the triage vital signs and the nursing notes.  Pertinent labs & imaging results that were available during my care of the patient were reviewed by me  and considered in my medical decision making (see chart for details).    Marissa Gibson is a 42 y.o. female who presents to ED for laceration of the right lower leg. Wound thoroughly cleaned in ED today. Wound explored and bottom of wound seen in a bloodless field without evidence of foreign body.  Patient neurovascular intact to bilateral lower extremities, no pain with range of motion, 5/5 strength.  Due to extent of laceration with adipose tissue present wound was loosely approximated, left slightly open to allow for possible drainage.  Patient informed to change dressing twice daily.  Due to patient's chronic steroid use and mechanism of injury patient was strongly encouraged to present for wound recheck in 3 days either at her primary care provider or here at emergency department.  Patient and her husband both state understanding to have wound rechecked in 3 days or to return sooner if signs of infection develop.  Laceration repaired as dictated above.  Patient counseled on home wound care.  Patient and husband informed that sutures are to be removed in 8-10 days. Patient was urged to return to the Emergency Department for worsening pain, swelling, expanding erythema especially if it streaks away from the affected area, fever, or for any additional concerns. Patient verbalized understanding of return precautions.  First dose of Augmentin given here in emergency department.  Augmentin twice daily prescribed for the next 10 days.   Patient reports she received Tdap earlier this year.  At this time there does not appear to be any evidence of an acute emergency medical condition and the patient appears stable for discharge with appropriate outpatient follow up. Diagnosis was discussed with patient who verbalizes understanding of care plan and is agreeable to discharge. I have discussed return precautions with patient and  husband who verbalize understanding of return precautions. Patient strongly  encouraged to follow-up with their PCP within 3 days. All questions answered.  Patient's case discussed with Dr. Estell Harpin who agrees with plan of loose approximation of wound, Augmentin and to discharge with follow-up/wound check in 3 days.   Note: Portions of this report may have been transcribed using voice recognition software. Every effort was made to ensure accuracy; however, inadvertent computerized transcription errors may still be present. Final Clinical Impressions(s) / ED Diagnoses   Final diagnoses:  Laceration of right lower extremity, initial encounter    ED Discharge Orders         Ordered    amoxicillin-clavulanate (AUGMENTIN) 875-125 MG tablet  Every 12 hours     03/05/18 1126           Elizabeth Palau 03/05/18 1143    Bethann Berkshire, MD 03/06/18 [phone]

## 2018-03-05 NOTE — Discharge Instructions (Addendum)
You have been diagnosed today with laceration of the right lower extremity.  At this time there does not appear to be the presence of an emergent medical condition, however there is always the potential for conditions to change. Please read and follow the below instructions.  Please return to the Emergency Department immediately for any new or worsening symptoms Please be sure to follow up with your Primary Care Provider on Monday for wound recheck; please call their office to schedule an appointment even if you are feeling better for a follow-up visit. Due to your mechanism of injury and higher risk for infection it is very important for you to have your wound rechecked in 3 days.  You may have your wound rechecked by your primary care provider or here at the emergency department.  If you see signs of infection please return to the emergency department sooner for recheck as further treatment may be required.  These take the antibiotic Augmentin as prescribed for the next 10 days. Please keep the area covered with clean gauze and change twice daily.  You must have the sutures removed in 8-10 days. You may have this done at your primary doctor or here at the ED.  Get help right away if: You develop severe swelling around the injury site. Your pain suddenly increases and is severe. You develop painful lumps near the wound or on skin that is anywhere on your body. You have a red streak going away from your wound. The wound is on your hand or foot and you cannot properly move a finger or toe. The wound is on your hand or foot and you notice that your fingers or toes look pale or bluish. You have a fever. A wound that was closed breaks open. You notice a bad smell coming from the wound. You notice something coming out of the wound, such as wood or glass. You have increased redness, swelling, or pain at the site of your wound. You have fluid, blood, or pus coming from your wound. You notice a change  in the color of your skin near your wound. You need to change the dressing frequently due to fluid, blood, or pus draining from the wound. You develop a new rash.  Please read the additional information packets attached to your discharge summary.  Do not take your medicine if  develop an itchy rash, swelling in your mouth or lips, or difficulty breathing.

## 2018-03-30 ENCOUNTER — Ambulatory Visit
Admission: RE | Admit: 2018-03-30 | Discharge: 2018-03-30 | Disposition: A | Discharge status: Home / Self Care | Payer: Medicare Other | Source: Ambulatory Visit | Attending: Obstetrics and Gynecology | Admitting: Obstetrics and Gynecology

## 2018-03-30 DIAGNOSIS — M8589 Other specified disorders of bone density and structure, multiple sites: Secondary | ICD-10-CM

## 2018-04-23 ENCOUNTER — Other Ambulatory Visit: Payer: Self-pay | Admitting: Physician Assistant

## 2018-04-23 DIAGNOSIS — Z1231 Encounter for screening mammogram for malignant neoplasm of breast: Secondary | ICD-10-CM

## 2018-06-16 ENCOUNTER — Ambulatory Visit
Admission: RE | Admit: 2018-06-16 | Discharge: 2018-06-16 | Disposition: A | Discharge status: Home / Self Care | Payer: Medicare Other | Source: Ambulatory Visit | Attending: Physician Assistant | Admitting: Physician Assistant

## 2018-06-16 DIAGNOSIS — Z1231 Encounter for screening mammogram for malignant neoplasm of breast: Secondary | ICD-10-CM

## 2018-09-22 ENCOUNTER — Other Ambulatory Visit: Payer: Self-pay

## 2018-09-22 ENCOUNTER — Emergency Department (HOSPITAL_COMMUNITY)
Admission: EM | Admit: 2018-09-22 | Discharge: 2018-09-22 | Disposition: A | Discharge status: Home / Self Care | Payer: Medicare Other | Attending: Emergency Medicine | Admitting: Emergency Medicine

## 2018-09-22 ENCOUNTER — Encounter (HOSPITAL_COMMUNITY): Payer: Self-pay | Admitting: Emergency Medicine

## 2018-09-22 ENCOUNTER — Emergency Department (HOSPITAL_COMMUNITY): Payer: Medicare Other

## 2018-09-22 DIAGNOSIS — I10 Essential (primary) hypertension: Secondary | ICD-10-CM | POA: Insufficient documentation

## 2018-09-22 DIAGNOSIS — R1013 Epigastric pain: Secondary | ICD-10-CM | POA: Diagnosis present

## 2018-09-22 DIAGNOSIS — J45909 Unspecified asthma, uncomplicated: Secondary | ICD-10-CM | POA: Diagnosis not present

## 2018-09-22 DIAGNOSIS — R1010 Upper abdominal pain, unspecified: Secondary | ICD-10-CM

## 2018-09-22 DIAGNOSIS — Z79899 Other long term (current) drug therapy: Secondary | ICD-10-CM | POA: Diagnosis not present

## 2018-09-22 LAB — CBC WITH DIFFERENTIAL/PLATELET
Abs Immature Granulocytes: 0.08 10*3/uL — ABNORMAL HIGH (ref 0.00–0.07)
Basophils Absolute: 0.1 10*3/uL (ref 0.0–0.1)
Basophils Relative: 1 %
Eosinophils Absolute: 0.2 10*3/uL (ref 0.0–0.5)
Eosinophils Relative: 1 %
HCT: 44.8 % (ref 36.0–46.0)
Hemoglobin: 14.8 g/dL (ref 12.0–15.0)
Immature Granulocytes: 1 %
Lymphocytes Relative: 17 %
Lymphs Abs: 2.1 10*3/uL (ref 0.7–4.0)
MCH: 32.2 pg (ref 26.0–34.0)
MCHC: 33 g/dL (ref 30.0–36.0)
MCV: 97.6 fL (ref 80.0–100.0)
Monocytes Absolute: 0.9 10*3/uL (ref 0.1–1.0)
Monocytes Relative: 8 %
Neutro Abs: 8.9 10*3/uL — ABNORMAL HIGH (ref 1.7–7.7)
Neutrophils Relative %: 72 %
Platelets: 450 10*3/uL — ABNORMAL HIGH (ref 150–400)
RBC: 4.59 MIL/uL (ref 3.87–5.11)
RDW: 12.5 % (ref 11.5–15.5)
WBC: 12.2 10*3/uL — ABNORMAL HIGH (ref 4.0–10.5)
nRBC: 0 % (ref 0.0–0.2)

## 2018-09-22 LAB — COMPREHENSIVE METABOLIC PANEL
ALT: 18 U/L (ref 0–44)
AST: 17 U/L (ref 15–41)
Albumin: 3.5 g/dL (ref 3.5–5.0)
Alkaline Phosphatase: 69 U/L (ref 38–126)
Anion gap: 9 (ref 5–15)
BUN: 12 mg/dL (ref 6–20)
CO2: 24 mmol/L (ref 22–32)
Calcium: 9.1 mg/dL (ref 8.9–10.3)
Chloride: 105 mmol/L (ref 98–111)
Creatinine, Ser: 0.65 mg/dL (ref 0.44–1.00)
GFR calc Af Amer: >60 mL/min (ref >60)
GFR calc non Af Amer: >60 mL/min (ref >60)
Glucose, Bld: 94 mg/dL (ref 70–99)
Potassium: 3.9 mmol/L (ref 3.5–5.1)
Sodium: 138 mmol/L (ref 135–145)
Total Bilirubin: 0.3 mg/dL (ref 0.3–1.2)
Total Protein: 6.9 g/dL (ref 6.5–8.1)

## 2018-09-22 LAB — URINALYSIS, ROUTINE W REFLEX MICROSCOPIC
Bilirubin Urine: NEGATIVE
Glucose, UA: NEGATIVE mg/dL
Hgb urine dipstick: NEGATIVE
Ketones, ur: NEGATIVE mg/dL
Leukocytes,Ua: NEGATIVE
Nitrite: NEGATIVE
Protein, ur: NEGATIVE mg/dL
Specific Gravity, Urine: 1.008 (ref 1.005–1.030)
pH: 6 (ref 5.0–8.0)

## 2018-09-22 LAB — LIPASE, BLOOD: Lipase: 31 U/L (ref 11–51)

## 2018-09-22 MED ORDER — METOCLOPRAMIDE HCL 5 MG/ML IJ SOLN
10.0000 mg | Freq: Once | INTRAMUSCULAR | Status: AC
  Administered 2018-09-22: 10 mg via INTRAVENOUS
  Filled 2018-09-22: qty 2

## 2018-09-22 MED ORDER — HYDROMORPHONE HCL 1 MG/ML IJ SOLN
1.0000 mg | Freq: Once | INTRAMUSCULAR | Status: AC
  Administered 2018-09-22: 1 mg via INTRAVENOUS
  Filled 2018-09-22: qty 1

## 2018-09-22 MED ORDER — PANTOPRAZOLE SODIUM 40 MG IV SOLR
40.0000 mg | Freq: Once | INTRAVENOUS | Status: AC
  Administered 2018-09-22: 40 mg via INTRAVENOUS
  Filled 2018-09-22: qty 40

## 2018-09-22 MED ORDER — ONDANSETRON HCL 4 MG/2ML IJ SOLN
4.0000 mg | Freq: Once | INTRAMUSCULAR | Status: AC
  Administered 2018-09-22: 4 mg via INTRAVENOUS
  Filled 2018-09-22: qty 2

## 2018-09-22 MED ORDER — PANTOPRAZOLE SODIUM 20 MG PO TBEC: 20.0000 mg | DELAYED_RELEASE_TABLET | Freq: Every day | ORAL | 0 refills | Status: AC

## 2018-09-22 MED ORDER — IOHEXOL 300 MG/ML  SOLN
100.0000 mL | Freq: Once | INTRAMUSCULAR | Status: AC | PRN
  Administered 2018-09-22: 100 mL via INTRAVENOUS

## 2018-09-22 NOTE — ED Provider Notes (Signed)
[facility]ERGENCY DEPARTMENT Provider Note   CSN: [id number] Arrival date & time: 09/22/18  1447     History   Chief Complaint Chief Complaint  Patient presents with  . Flank Pain    HPI Marissa Gibson is a 43 y.o. female.     Patient complains of epigastric abdominal pain.  Also pain in her flank area.  She was recently treated with antibiotics.  The history is provided by the patient. No language interpreter was used.  Abdominal Pain Pain location:  Epigastric Pain quality: aching   Pain radiates to:  Back Pain severity:  Moderate Onset quality:  Sudden Timing:  Constant Progression:  Worsening Chronicity:  New Context: not alcohol use   Relieved by:  Nothing Associated symptoms: no chest pain, no cough, no diarrhea, no fatigue and no hematuria     Past Medical History:  Diagnosis Date  . Asthma   . Cystitis   . Endometriosis   . Hypertension   . Osteopenia   . Ovarian cyst     Patient Active Problem List   Diagnosis Date Noted  . Encephalopathy acute 09/17/2015  . Altered mental status   . Acute encephalopathy 09/16/2015  . Physical deconditioning 09/16/2015  . Oral thrush 09/16/2015  . Severe persistent asthma 09/16/2015  . 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 Stridor 08/01/2015  . Leukocytosis 08/01/2015  . Sinus tachycardia 08/01/2015  . Hypokalemia 08/01/2015  . Interstitial cystitis 08/01/2015  . Essential hypertension 08/01/2015  . SIRS (systemic inflammatory response syndrome) (HCC) 08/01/2015  . Asthma exacerbation 07/31/2015    Past Surgical History:  Procedure Laterality Date  . ABDOMINAL HYSTERECTOMY    . ABLATION ON ENDOMETRIOSIS    . APPENDECTOMY    . CHOLECYSTECTOMY    . HERNIA REPAIR    . KNEE SURGERY    . OVARIAN CYST REMOVAL       OB History   No obstetric history on file.      Home Medications    Prior to Admission medications   Medication Sig Start Date End Date Taking? Authorizing Provider  acetaminophen  (TYLENOL) 325 MG tablet Take 650 mg by mouth every 6 (six) hours as needed for mild pain.    [provider]  albuterol (PROVENTIL HFA;VENTOLIN HFA) 108 (90 BASE) MCG/ACT inhaler Inhale 1 puff into the lungs every 6 (six) hours as needed for wheezing or shortness of breath.    [provider]  albuterol (PROVENTIL) (2.5 MG/3ML) 0.083% nebulizer solution Take 2.5 mg by nebulization every 8 (eight) hours as needed for wheezing or shortness of breath.    [provider]  amLODipine (NORVASC) 5 MG tablet Take 5 mg by mouth daily.    [provider]  Beclomethasone Dipropionate (QNASL) 80 MCG/ACT AERS Place 1 spray into the nose 2 (two) times daily.     [provider]  budesonide (RHINOCORT ALLERGY) 32 MCG/ACT nasal spray Place 1 spray into both nostrils daily.    [provider]  bupivacaine (MARCAINE) 0.5 % injection 10 mLs by Infiltration route See admin instructions. 2-3 times per day combined with Sodium Bicarbonate for bladder instillation.    [provider]  Calcium Carb-Cholecalciferol (CALCIUM-VITAMIN D3) 600-400 MG-UNIT CAPS Take 1 capsule by mouth once a week.     [provider]  calcium-vitamin D (OSCAL WITH D) 500-200 MG-UNIT per tablet Take 1 tablet by mouth 2 (two) times daily.     [provider]  cholestyramine (QUESTRAN) 4 g packet Take 4  g by mouth 2 (two) times daily.    [provider]  CRANBERRY EXTRACT PO Take 1 tablet by mouth daily.     [provider]  Dupilumab 300 MG/2ML SOSY Inject 2 mLs into the skin every 14 (fourteen) days.  06/13/17   [provider]  EPINEPHrine (EPIPEN 2-PAK) 0.3 mg/0.3 mL IJ SOAJ injection Inject 0.3 mg into the muscle as needed (for anaphylaxis).    [provider]  erythromycin ophthalmic ointment APPLY TO EYELIDS 2 3 TIMES DAILY FOR 1 WEEK 01/13/18   [provider]  FIBER PO Take 2 tablets by mouth daily.    [provider]  fluticasone (FLOVENT HFA) 220 MCG/ACT inhaler Inhale 2 puffs into the lungs 2 (two) times daily.  09/29/17   [provider]  glucosamine-chondroitin (COSAMIN DS) 500-400 MG tablet Take 1 tablet by mouth daily.    [provider]  guaiFENesin (MUCINEX) 600 MG 12 hr tablet Take 1,200 mg by mouth 2 (two) times daily as needed for cough.     [provider]  heparin 10000 UNIT/ML injection 20,000 Units See admin instructions. Instilled into bladder twice daily 06/20/17   [provider]  HYDROcodone-acetaminophen (NORCO/VICODIN) 5-325 MG tablet Take 1 tablet by mouth every 6 (six) hours as needed for moderate pain.    [provider]  ipratropium (ATROVENT) 0.02 % nebulizer solution Inhale into the lungs. 03/28/16   [provider]  ipratropium (ATROVENT) 0.06 % nasal spray Place 1 spray into both nostrils 2 (two) times daily.     [provider]  levocetirizine (XYZAL) 5 MG tablet Take 5 mg by mouth every morning.     [provider]  levonorgestrel (MIRENA) 20 MCG/24HR IUD 1 each by Intrauterine route once.    [provider]  losartan (COZAAR) 50 MG tablet Take 50 mg by mouth daily.    [provider]  Methen-Hyosc-Meth Blue-Na Phos (UROGESIC-BLUE) 81.6 MG TABS Take 1 tablet by mouth See admin instructions. Takes 2-3 times daily    [provider]  metroNIDAZOLE (METROGEL) 0.75 % gel Apply 1 application topically daily.  02/02/18   [provider]  Multiple Vitamin (MULTIVITAMIN WITH MINERALS) TABS tablet Take 1 tablet by mouth daily.    [provider]  pantoprazole (PROTONIX) 20 MG tablet Take 1 tablet (20 mg total) by mouth daily. 09/22/18   Milton Ferguson, MD  pentosan polysulfate (ELMIRON) 100 MG capsule Take 100 mg by mouth 3 (three) times daily.    [provider]  Tiotropium Bromide-Olodaterol (STIOLTO RESPIMAT) 2.5-2.5 MCG/ACT AERS Inhale 1 puff into the  lungs 2 (two) times daily.    [provider]  triamcinolone acetonide (TRIESENCE) 40 MG/ML SUSP Inject 40 mg into the muscle See admin instructions.  05/29/17   [provider]  Vitamin D, Ergocalciferol, (DRISDOL) 1.25 MG (50000 UT) CAPS capsule Take 50,000 Units by mouth every 7 (seven) days.  10/04/17   [provider]  zileuton (ZYFLO CR) 600 MG CR tablet Take 1,200 mg by mouth 2 (two) times daily.    [provider]    Family History Family History  Problem Relation Age of Onset  . Breast cancer Paternal Aunt 15    Social History Social History   Tobacco Use  . Smoking status: Never Smoker  . Smokeless tobacco: Never Used  Substance Use Topics  . Alcohol use: Yes  . Drug use: No     Allergies   Allspice [pimenta],  Other, Xolair [omalizumab], Amitriptyline, Duloxetine, Ibuprofen, Nsaids, Sudafed [pseudoephedrine hcl], Sudafed [pseudoephedrine], Tape, Benadryl [diphenhydramine], Nucala [mepolizumab], Phenylephrine hcl, Phenylephrine-guaifenesin, Ceclor [cefaclor], Cymbalta [duloxetine hcl], and Neomycin-bacitracin zn-polymyx   Review of Systems Review of Systems  Constitutional: Negative for appetite change and fatigue.  HENT: Negative for congestion, ear discharge and sinus pressure.   Eyes: Negative for discharge.  Respiratory: Negative for cough.   Cardiovascular: Negative for chest pain.  Gastrointestinal: Positive for abdominal pain. Negative for diarrhea.  Genitourinary: Negative for frequency and hematuria.  Musculoskeletal: Negative for back pain.  Skin: Negative for rash.  Neurological: Negative for seizures and headaches.  Psychiatric/Behavioral: Negative for hallucinations.     Physical Exam Updated Vital Signs BP (!) 141/91   Pulse 88   Temp 98 F (36.7 C) (Oral)   Resp 16   Ht 5\' 6"  (1.676 m)   Wt 74.8 kg   LMP 03/08/2013   SpO2 98%   BMI 26.63 kg/m   Physical Exam Vitals signs and nursing note reviewed.   Constitutional:      Appearance: She is well-developed.  HENT:     Head: Normocephalic.     Nose: Nose normal.  Eyes:     General: No scleral icterus.    Conjunctiva/sclera: Conjunctivae normal.  Neck:     Musculoskeletal: Neck supple.     Thyroid: No thyromegaly.  Cardiovascular:     Rate and Rhythm: Normal rate and regular rhythm.     Heart sounds: No murmur. No friction rub. No gallop.   Pulmonary:     Breath sounds: No stridor. No wheezing or rales.  Chest:     Chest wall: No tenderness.  Abdominal:     General: There is no distension.     Tenderness: There is abdominal tenderness. There is no rebound.  Musculoskeletal: Normal range of motion.  Lymphadenopathy:     Cervical: No cervical adenopathy.  Skin:    Findings: No erythema or rash.  Neurological:     Mental Status: She is oriented to person, place, and time.     Motor: No abnormal muscle tone.     Coordination: Coordination normal.  Psychiatric:        Behavior: Behavior normal.      ED Treatments / Results  Labs (all labs ordered are listed, but only abnormal results are displayed) Labs Reviewed  URINALYSIS, ROUTINE W REFLEX MICROSCOPIC - Abnormal; Notable for the following components:      Result Value   Color, Urine STRAW (*)    All other components within normal limits  CBC WITH DIFFERENTIAL/PLATELET - Abnormal; Notable for the following components:   WBC 12.2 (*)    Platelets 450 (*)    Neutro Abs 8.9 (*)    Abs Immature Granulocytes 0.08 (*)    All other components within normal limits  URINE CULTURE  COMPREHENSIVE METABOLIC PANEL  LIPASE, BLOOD    EKG    Radiology Ct Abdomen Pelvis W Contrast  Result Date: 09/22/2018 CLINICAL DATA:  Bilateral flank pain radiating into lower abdomen since Friday. Patient has a history of interstitial cystitis with a recently treated UTI. EXAM: CT ABDOMEN AND PELVIS WITH CONTRAST TECHNIQUE: Multidetector CT imaging of the abdomen and pelvis was performed  using the standard protocol following bolus administration of intravenous contrast. CONTRAST:  100mL OMNIPAQUE IOHEXOL 300 MG/ML  SOLN COMPARISON:  CT dated 06/24/2007. FINDINGS: Lower chest: No acute abnormality. Hepatobiliary: The patient is status post prior cholecystectomy. The liver is enlarged. There is intrahepatic and  extrahepatic biliary ductal dilatation. This has progressed since the patient's prior CT in 2009. Pancreas: Unremarkable. No pancreatic ductal dilatation or surrounding inflammatory changes. Spleen: Normal in size without focal abnormality. Adrenals/Urinary Tract: The adrenal glands are unremarkable. There are no radiopaque kidney stones. There is no hydronephrosis. The bladder is significantly distended with a moderate amount of air within the bladder lumen. Stomach/Bowel: There is a moderate amount of stool throughout the colon. The appendix is not reliably identified and is felt to be surgically absent. The stomach is unremarkable. There is no evidence of a small-bowel obstruction. Vascular/Lymphatic: No significant vascular findings are present. No enlarged abdominal or pelvic lymph nodes. Reproductive: There is a 4.7 cm left adnexal cystic structure. The right ovary is unremarkable. The patient appears to be status post hysterectomy. Other: No abdominal wall hernia or abnormality. No abdominopelvic ascites. Musculoskeletal: No acute or significant osseous findings. IMPRESSION: 1. Moderately distended urinary bladder with intraluminal gas. This may be secondary to recent instrumentation versus a gas-forming organism. Correlation with patient history and laboratory studies is recommended. 2. Intrahepatic and extrahepatic biliary ductal dilatation which has progressed since the patient's CT in 2009. While this may be a benign finding, consider outpatient follow-up with MRCP or ERCP for further evaluation. This can also be correlated with the patient's laboratory studies including total  bilirubin. 3. A 4.7 cm cystic structures noted in the left ovary. If there is clinical suspicion for an acute left ovarian abnormality, follow-up ultrasound is recommended. Electronically Signed   By: Katherine Mantlehristopher  Green M.D.   On: 09/22/2018 19:19    Procedures Procedures (including critical care time)  Medications Ordered in ED Medications  pantoprazole (PROTONIX) injection 40 mg (40 mg Intravenous Given 09/22/18 1808)  HYDROmorphone (DILAUDID) injection 1 mg (1 mg Intravenous Given 09/22/18 1806)  ondansetron (ZOFRAN) injection 4 mg (4 mg Intravenous Given 09/22/18 1804)  iohexol (OMNIPAQUE) 300 MG/ML solution 100 mL (100 mLs Intravenous Contrast Given 09/22/18 1828)  metoCLOPramide (REGLAN) injection 10 mg (10 mg Intravenous Given 09/22/18 1903)     Initial Impression / Assessment and Plan / ED Course  I have reviewed the triage vital signs and the nursing notes.  Pertinent labs & imaging results that were available during my care of the patient were reviewed by me and considered in my medical decision making (see chart for details).       CT scan shows possible ovarian cyst.  Labs unremarkable.  Patient will be placed on Protonix and will follow-up with her PCP.  Her urine was cultured but she is recently finished antibiotics  Final Clinical Impressions(s) / ED Diagnoses   Final diagnoses:  Pain of upper abdomen    ED Discharge Orders         Ordered    pantoprazole (PROTONIX) 20 MG tablet  Daily     09/22/18 1939           Bethann BerkshireZammit, Mikayah Joy, MD 09/22/18 1941

## 2018-09-22 NOTE — ED Triage Notes (Signed)
Pt c/o bilateral flank pain radiating into lower abdomen since Friday. Pt states she has interstitial cystitis and was recently treated for UTI. States she finished antibiotics x 2 weeks ago and urine culture was negative. Denies hematuria/fever/n/v. nad noted.

## 2018-09-22 NOTE — ED Notes (Signed)
Pt states she is nauseous, EDP notified and orders received.

## 2018-09-22 NOTE — Discharge Instructions (Addendum)
Follow-up with your family doctor in 2 to 3 days for recheck °

## 2018-09-24 LAB — URINE CULTURE: Culture: <10000 — AB

## 2018-10-28 ENCOUNTER — Other Ambulatory Visit: Payer: Self-pay

## 2018-10-28 ENCOUNTER — Other Ambulatory Visit (HOSPITAL_COMMUNITY): Admission: RE | Admit: 2018-10-28 | Payer: Medicare Other | Source: Ambulatory Visit

## 2018-10-28 ENCOUNTER — Other Ambulatory Visit: Payer: Medicare Other

## 2018-10-28 DIAGNOSIS — Z20822 Contact with and (suspected) exposure to covid-19: Secondary | ICD-10-CM

## 2018-11-01 LAB — NOVEL CORONAVIRUS, NAA: SARS-CoV-2, NAA: NOT DETECTED

## 2018-11-06 ENCOUNTER — Ambulatory Visit: Payer: Medicare Other | Admitting: Hematology & Oncology

## 2018-11-06 ENCOUNTER — Other Ambulatory Visit: Payer: Medicare Other

## 2019-05-21 ENCOUNTER — Other Ambulatory Visit: Payer: Self-pay | Admitting: Physician Assistant

## 2019-05-21 DIAGNOSIS — Z1231 Encounter for screening mammogram for malignant neoplasm of breast: Secondary | ICD-10-CM

## 2019-06-29 ENCOUNTER — Other Ambulatory Visit: Payer: Self-pay

## 2019-06-29 ENCOUNTER — Ambulatory Visit
Admission: RE | Admit: 2019-06-29 | Discharge: 2019-06-29 | Disposition: A | Discharge status: Home / Self Care | Payer: Medicare Other | Source: Ambulatory Visit | Attending: Physician Assistant | Admitting: Physician Assistant

## 2019-06-29 DIAGNOSIS — Z1231 Encounter for screening mammogram for malignant neoplasm of breast: Secondary | ICD-10-CM
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
## 2019-12-05 ENCOUNTER — Encounter (HOSPITAL_COMMUNITY): Payer: Self-pay | Admitting: Emergency Medicine

## 2019-12-05 ENCOUNTER — Other Ambulatory Visit: Payer: Self-pay

## 2019-12-05 DIAGNOSIS — Y998 Other external cause status: Secondary | ICD-10-CM | POA: Diagnosis not present

## 2019-12-05 DIAGNOSIS — S81811A Laceration without foreign body, right lower leg, initial encounter: Secondary | ICD-10-CM | POA: Insufficient documentation

## 2019-12-05 DIAGNOSIS — W5501XA Bitten by cat, initial encounter: Secondary | ICD-10-CM | POA: Insufficient documentation

## 2019-12-05 DIAGNOSIS — I10 Essential (primary) hypertension: Secondary | ICD-10-CM | POA: Insufficient documentation

## 2019-12-05 DIAGNOSIS — J45901 Unspecified asthma with (acute) exacerbation: Secondary | ICD-10-CM | POA: Insufficient documentation

## 2019-12-05 DIAGNOSIS — Y9389 Activity, other specified: Secondary | ICD-10-CM | POA: Diagnosis not present

## 2019-12-05 DIAGNOSIS — Y9289 Other specified places as the place of occurrence of the external cause: Secondary | ICD-10-CM | POA: Insufficient documentation

## 2019-12-05 DIAGNOSIS — Z79899 Other long term (current) drug therapy: Secondary | ICD-10-CM | POA: Diagnosis not present

## 2019-12-05 DIAGNOSIS — Z7951 Long term (current) use of inhaled steroids: Secondary | ICD-10-CM | POA: Diagnosis not present

## 2019-12-05 NOTE — ED Triage Notes (Signed)
Cat between pt legs   Started and clawed pt R leg   Pt has dressed leg   UTD on DT  Cat UTD on shots

## 2019-12-05 NOTE — ED Triage Notes (Signed)
Pt with a 2 in skin tear to her L mid calf   Pressure dressing applied   Pt with 1/3 puncture to lower inner L leg  Sub Q tissue exposed with bleeding  Pressure dressing applied

## 2019-12-06 ENCOUNTER — Emergency Department (HOSPITAL_COMMUNITY)
Admission: EM | Admit: 2019-12-06 | Discharge: 2019-12-06 | Disposition: A | Discharge status: Home / Self Care | Payer: Medicare Other | Attending: Emergency Medicine | Admitting: Emergency Medicine

## 2019-12-06 DIAGNOSIS — S81811A Laceration without foreign body, right lower leg, initial encounter: Secondary | ICD-10-CM

## 2019-12-06 HISTORY — DX: Primary adrenocortical insufficiency: E27.1

## 2019-12-06 MED ORDER — LIDOCAINE-EPINEPHRINE (PF) 1 %-1:200000 IJ SOLN
10.0000 mL | Freq: Once | INTRAMUSCULAR | Status: AC
  Administered 2019-12-06: 10 mL

## 2019-12-06 MED ORDER — DOXYCYCLINE HYCLATE 100 MG PO CAPS: 100.0000 mg | ORAL_CAPSULE | Freq: Two times a day (BID) | ORAL | 0 refills | Status: AC

## 2019-12-06 NOTE — ED Notes (Signed)
ED Provider at bedside. 

## 2019-12-06 NOTE — ED Provider Notes (Signed)
[facility] EMERGENCY DEPARTMENT Provider Note   CSN: [id number] Arrival date & time: 12/05/19  2243     History Chief Complaint  Patient presents with  . Leg Injury    Marissa Gibson is a 44 y.o. female.  Resents to the emergency department for evaluation of lacerations to the right lower leg.  Patient reports that her kitten grabbed onto her leg with its claws causing the lacerations.  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  She has minimal pain.  She did dress the wounds at home and bleeding appears to be controlled.        Past Medical History:  Diagnosis Date  . Adrenal insufficiency (Addison's disease) (HCC)   . Asthma   . Cystitis   . Endometriosis   . Hypertension   . Osteopenia   . Ovarian cyst     Patient Active Problem List   Diagnosis Date Noted  . Encephalopathy acute 09/17/2015  . Altered mental status   . Acute encephalopathy 09/16/2015  . Physical deconditioning 09/16/2015  . Oral thrush 09/16/2015  . Severe persistent asthma 09/16/2015  . 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 Stridor 08/01/2015  . Leukocytosis 08/01/2015  . Sinus tachycardia 08/01/2015  . Hypokalemia 08/01/2015  . Interstitial cystitis 08/01/2015  . Essential hypertension 08/01/2015  . SIRS (systemic inflammatory response syndrome) (HCC) 08/01/2015  . Asthma exacerbation 07/31/2015    Past Surgical History:  Procedure Laterality Date  . ABDOMINAL HYSTERECTOMY    . ABLATION ON ENDOMETRIOSIS    . APPENDECTOMY    . CHOLECYSTECTOMY    . HERNIA REPAIR    . KNEE SURGERY    . OVARIAN CYST REMOVAL       OB History   No obstetric history on file.     Family History  Problem Relation Age of Onset  . Breast cancer Paternal Aunt 30    Social History   Tobacco Use  . Smoking status: Never Smoker  . Smokeless tobacco: Never Used  Vaping Use  . Vaping Use: Never used    Substance Use Topics  . Alcohol use: Not Currently  . Drug use: No    Home Medications Prior to Admission medications   Medication Sig Start Date End Date Taking? Authorizing Provider  acetaminophen (TYLENOL) 325 MG tablet Take 650 mg by mouth every 6 (six) hours as needed for mild pain.    [provider]  albuterol (PROVENTIL HFA;VENTOLIN HFA) 108 (90 BASE) MCG/ACT inhaler Inhale 1 puff into the lungs every 6 (six) hours as needed for wheezing or shortness of breath.    [provider]  albuterol (PROVENTIL) (2.5 MG/3ML) 0.083% nebulizer solution Take 2.5 mg by nebulization every 8 (eight) hours as needed for wheezing or shortness of breath.    [provider]  amLODipine (NORVASC) 5 MG tablet Take 5 mg by mouth daily.    [provider]  Beclomethasone Dipropionate (QNASL) 80 MCG/ACT AERS Place 1 spray into the nose 2 (two) times daily.     [provider]  budesonide (RHINOCORT ALLERGY) 32 MCG/ACT nasal spray Place 1 spray into both nostrils daily.    [provider]  bupivacaine (MARCAINE) 0.5 % injection 10 mLs by Infiltration route See admin instructions. 2-3 times per day combined with Sodium Bicarbonate for bladder instillation.    [provider]  Calcium Carb-Cholecalciferol (CALCIUM-VITAMIN D3) 600-400 MG-UNIT CAPS Take 1 capsule by mouth once  a week.     [provider]  calcium-vitamin D (OSCAL WITH D) 500-200 MG-UNIT per tablet Take 1 tablet by mouth 2 (two) times daily.     [provider]  cholestyramine (QUESTRAN) 4 g packet Take 4 g by mouth 2 (two) times daily.    [provider]  CRANBERRY EXTRACT PO Take 1 tablet by mouth daily.     [provider]  doxycycline (VIBRAMYCIN) 100 MG capsule Take 1 capsule (100 mg total) by mouth 2 (two) times daily. 12/06/19   Akua Blethen, Canary Brim, MD  Dupilumab 300 MG/2ML SOSY Inject 2 mLs into the skin every 14 (fourteen) days.  06/13/17    [provider]  EPINEPHrine (EPIPEN 2-PAK) 0.3 mg/0.3 mL IJ SOAJ injection Inject 0.3 mg into the muscle as needed (for anaphylaxis).    [provider]  erythromycin ophthalmic ointment APPLY TO EYELIDS 2 3 TIMES DAILY FOR 1 WEEK 01/13/18   [provider]  FIBER PO Take 2 tablets by mouth daily.    [provider]  fluticasone (FLOVENT HFA) 220 MCG/ACT inhaler Inhale 2 puffs into the lungs 2 (two) times daily.  09/29/17   [provider]  glucosamine-chondroitin (COSAMIN DS) 500-400 MG tablet Take 1 tablet by mouth daily.    [provider]  guaiFENesin (MUCINEX) 600 MG 12 hr tablet Take 1,200 mg by mouth 2 (two) times daily as needed for cough.     [provider]  heparin 56314 UNIT/ML injection 20,000 Units See admin instructions. Instilled into bladder twice daily 06/20/17   [provider]  HYDROcodone-acetaminophen (NORCO/VICODIN) 5-325 MG tablet Take 1 tablet by mouth every 6 (six) hours as needed for moderate pain.    [provider]  ipratropium (ATROVENT) 0.02 % nebulizer solution Inhale into the lungs. 03/28/16   [provider]  ipratropium (ATROVENT) 0.06 % nasal spray Place 1 spray into both nostrils 2 (two) times daily.     [provider]  levocetirizine (XYZAL) 5 MG tablet Take 5 mg by mouth every morning.     [provider]  levonorgestrel (MIRENA) 20 MCG/24HR IUD 1 each by Intrauterine route once.    [provider]  losartan (COZAAR) 50 MG tablet Take 50 mg by mouth daily.    [provider]  Methen-Hyosc-Meth Blue-Na Phos (UROGESIC-BLUE) 81.6 MG TABS Take 1 tablet by mouth See admin instructions. Takes 2-3 times daily    [provider]  metroNIDAZOLE (METROGEL) 0.75 % gel Apply 1 application topically daily.  02/02/18   [provider]  Multiple Vitamin (MULTIVITAMIN WITH MINERALS) TABS tablet Take 1 tablet by mouth daily.    [provider]  pantoprazole (PROTONIX) 20 MG tablet Take 1 tablet (20 mg total) by mouth daily. 09/22/18   Bethann Berkshire, MD  pentosan polysulfate (ELMIRON) 100 MG capsule Take 100 mg by mouth 3 (three) times daily.    [provider]  Tiotropium Bromide-Olodaterol (STIOLTO RESPIMAT) 2.5-2.5 MCG/ACT AERS Inhale 1 puff into the lungs 2 (two) times daily.    [provider]  triamcinolone acetonide (TRIESENCE) 40 MG/ML SUSP Inject 40 mg into the muscle See admin instructions.  05/29/17   [provider]  Vitamin D, Ergocalciferol, (DRISDOL) 1.25 MG (50000 UT) CAPS capsule Take 50,000 Units by mouth every 7 (seven) days.  10/04/17   [provider]  zileuton (ZYFLO CR) 600 MG CR tablet Take 1,200 mg by mouth 2 (two) times daily.    [provider]    Allergies    Allspice [pimenta], Other, Xolair [omalizumab], Amitriptyline, Duloxetine, Ibuprofen, Nsaids, Sudafed [pseudoephedrine hcl], Sudafed [pseudoephedrine], Tape, Benadryl [diphenhydramine], Nucala [mepolizumab], Phenylephrine hcl, Phenylephrine-guaifenesin, Ceclor [cefaclor], Cymbalta [duloxetine hcl], and Neomycin-bacitracin zn-polymyx  Review of Systems   Review of Systems  Skin: Positive for wound.  Neurological: Negative.     Physical Exam Updated Vital Signs BP (!) 152/97 (BP Location: Right Arm)   Pulse (!) 113   Resp 18   Ht 5\' 2"  (1.575 m)   Wt 86.9 kg   LMP 03/08/2013   SpO2 96%   BMI 35.04 kg/m   Physical Exam Vitals and nursing note reviewed.  Constitutional:      Appearance: Normal appearance.  HENT:     Head: Atraumatic.  Musculoskeletal:        General: Normal range of motion.     Right lower leg: Edema present.     Left lower leg: Edema present.  Skin:    General: Skin is warm.     Findings: Laceration present.     Comments: 1cm laceration near ankle 4cm laceration lateral mid-lower leg  Neurological:     Mental Status: She is alert.     Sensory: Sensation is  intact.     Motor: Motor function is intact.     ED Results / Procedures / Treatments   Labs (all labs ordered are listed, but only abnormal results are displayed) Labs Reviewed - No data to display  EKG None  Radiology No results found.  Procedures .14/01/2014Laceration Repair  Date/Time: 12/06/2019 2:46 AM Performed by: 12/08/2019, MD Authorized by: Gilda Crease, MD   Consent:    Consent obtained:  Verbal   Consent given by:  Patient   Risks discussed:  Infection, pain and poor cosmetic result Universal protocol:    Procedure explained and questions answered to patient or proxy's satisfaction: yes     Required blood products, implants, devices, and special equipment available: yes     Site/side marked: yes     Immediately prior to procedure, a time out was called: yes     Patient identity confirmed:  Verbally with patient Anesthesia (see MAR for exact dosages):    Anesthesia method:  Local infiltration   Local anesthetic:  Lidocaine 1% WITH epi Laceration details:    Location:  Leg   Leg location:  L lower leg   Length (cm):  1 Repair type:    Repair type:  Simple Pre-procedure details:    Preparation:  Patient was prepped and draped in usual sterile fashion Exploration:    Hemostasis achieved with:  Epinephrine and direct pressure   Contaminated: no   Treatment:    Area cleansed with:  Betadine   Irrigation solution:  Sterile saline   Irrigation method:  Syringe Skin repair:    Repair method:  Sutures   Suture size:  3-0   Suture material:  Nylon   Suture technique:  Simple interrupted   Number of sutures:  2 Approximation:    Approximation:  Close Post-procedure details:    Patient tolerance of procedure:  Tolerated well, no immediate complications .Gilda CreaseLaceration Repair  Date/Time: 12/06/2019 2:48 AM Performed by: 12/08/2019, MD Authorized by: Gilda Crease, MD   Consent:    Consent obtained:  Verbal   Consent  given by:  Patient   Risks discussed:  Infection, pain and poor cosmetic result Universal protocol:    Procedure explained and questions answered to  patient or proxy's satisfaction: yes     Required blood products, implants, devices, and special equipment available: yes     Site/side marked: yes     Immediately prior to procedure, a time out was called: yes     Patient identity confirmed:  Verbally with patient Anesthesia (see MAR for exact dosages):    Anesthesia method:  Local infiltration   Local anesthetic:  Lidocaine 1% WITH epi Laceration details:    Location:  Leg   Leg location:  R lower leg   Length (cm):  4 Repair type:    Repair type:  Simple Pre-procedure details:    Preparation:  Patient was prepped and draped in usual sterile fashion Exploration:    Hemostasis achieved with:  Epinephrine and direct pressure Treatment:    Area cleansed with:  Betadine   Irrigation solution:  Sterile saline   Irrigation method:  Syringe Skin repair:    Repair method:  Sutures   Suture size:  3-0   Suture material:  Nylon   Suture technique:  Simple interrupted   Number of sutures:  6 Approximation:    Approximation:  Close Post-procedure details:    Patient tolerance of procedure:  Tolerated well, no immediate complications   (including critical care time)  Medications Ordered in ED Medications  lidocaine-EPINEPHrine (XYLOCAINE-EPINEPHrine) 1 %-1:200000 (PF) injection 10 mL (10 mLs Infiltration Given 12/06/19 0223)    ED Course  I have reviewed the triage vital signs and the nursing notes.  Pertinent labs & imaging results that were available during my care of the patient were reviewed by me and considered in my medical decision making (see chart for details).    MDM Rules/Calculators/A&P                          Patient presents to the emerge patient reports that she was not bitten, the cat got scared and grabbed onto her leg with its claws, causing the lacerations.   Patient has very thin skin secondary to her chronic steroid use.  I did discuss with her the possibility of the sutures not holding but recommended suture repair.  Sutures were placed without difficulty.  She will have the sutures removed in approximately 10 days.  She was given wound care instructions.  Will empirically place on antibiotics because of her chronic steroid use.  Wound was irrigated extensively prior to suture placement.  Final Clinical Impression(s) / ED Diagnoses Final diagnoses:  Lacerations of multiple sites of right leg, initial encounter    Rx / DC Orders ED Discharge Orders         Ordered    doxycycline (VIBRAMYCIN) 100 MG capsule  2 times daily        12/06/19 0251           Gilda CreasePollina, Kiyaan Haq J, MD 12/06/19 [phone]

## 2019-12-13 ENCOUNTER — Emergency Department (HOSPITAL_COMMUNITY)
Admission: EM | Admit: 2019-12-13 | Discharge: 2019-12-13 | Disposition: A | Discharge status: Home / Self Care | Payer: Medicare Other | Attending: Emergency Medicine | Admitting: Emergency Medicine

## 2019-12-13 ENCOUNTER — Encounter (HOSPITAL_COMMUNITY): Payer: Self-pay | Admitting: *Deleted

## 2019-12-13 ENCOUNTER — Other Ambulatory Visit: Payer: Self-pay

## 2019-12-13 DIAGNOSIS — Z7951 Long term (current) use of inhaled steroids: Secondary | ICD-10-CM | POA: Diagnosis not present

## 2019-12-13 DIAGNOSIS — Y939 Activity, unspecified: Secondary | ICD-10-CM | POA: Insufficient documentation

## 2019-12-13 DIAGNOSIS — S81811A Laceration without foreign body, right lower leg, initial encounter: Secondary | ICD-10-CM | POA: Diagnosis present

## 2019-12-13 DIAGNOSIS — Y929 Unspecified place or not applicable: Secondary | ICD-10-CM | POA: Insufficient documentation

## 2019-12-13 DIAGNOSIS — I10 Essential (primary) hypertension: Secondary | ICD-10-CM | POA: Diagnosis not present

## 2019-12-13 DIAGNOSIS — W5503XA Scratched by cat, initial encounter: Secondary | ICD-10-CM | POA: Insufficient documentation

## 2019-12-13 DIAGNOSIS — Y999 Unspecified external cause status: Secondary | ICD-10-CM | POA: Insufficient documentation

## 2019-12-13 DIAGNOSIS — Z79899 Other long term (current) drug therapy: Secondary | ICD-10-CM | POA: Insufficient documentation

## 2019-12-13 DIAGNOSIS — J441 Chronic obstructive pulmonary disease with (acute) exacerbation: Secondary | ICD-10-CM | POA: Insufficient documentation

## 2019-12-13 MED ORDER — CLINDAMYCIN HCL 300 MG PO CAPS: 300.0000 mg | ORAL_CAPSULE | Freq: Three times a day (TID) | ORAL | 0 refills | Status: AC

## 2019-12-13 MED ORDER — LIDOCAINE-EPINEPHRINE (PF) 1 %-1:200000 IJ SOLN
10.0000 mL | Freq: Once | INTRAMUSCULAR | Status: AC
  Administered 2019-12-13: 10 mL via INTRADERMAL
  Filled 2019-12-13: qty 30

## 2019-12-13 MED ORDER — CLINDAMYCIN HCL 150 MG PO CAPS
300.0000 mg | ORAL_CAPSULE | Freq: Once | ORAL | Status: AC
  Administered 2019-12-13: 300 mg via ORAL
  Filled 2019-12-13: qty 2

## 2019-12-13 MED ORDER — POVIDONE-IODINE 10 % EX SOLN
CUTANEOUS | Status: AC
  Filled 2019-12-13: qty 15

## 2019-12-13 NOTE — Discharge Instructions (Signed)
Suture removal in 10-14 days.

## 2019-12-13 NOTE — ED Provider Notes (Signed)
[facility] EMERGENCY DEPARTMENT Provider Note   CSN: [id number] Arrival date & time: 12/13/19  0813     History Chief Complaint  Patient presents with  . Laceration    Marissa Gibson is a 44 y.o. female.  HPI   43yf with laceration to R lower leg. Happened earlier today when her cat scratched her. She is on chronic steroids and reports that skin easily traumatized. Tetanus UTD. Was recently on abx for another laceration caused by cat that was sutured. These wounds are healing fine. Finished abx yesterday.   Past Medical History:  Diagnosis Date  . Adrenal insufficiency (Addison's disease) (HCC)   . Asthma   . Cystitis   . Endometriosis   . Hypertension   . Osteopenia   . Ovarian cyst     Patient Active Problem List   Diagnosis Date Noted  . Encephalopathy acute 09/17/2015  . Altered mental status   . Acute encephalopathy 09/16/2015  . Physical deconditioning 09/16/2015  . Oral thrush 09/16/2015  . Severe persistent asthma 09/16/2015  . 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 Stridor 08/01/2015  . Leukocytosis 08/01/2015  . Sinus tachycardia 08/01/2015  . Hypokalemia 08/01/2015  . Interstitial cystitis 08/01/2015  . Essential hypertension 08/01/2015  . SIRS (systemic inflammatory response syndrome) (HCC) 08/01/2015  . Asthma exacerbation 07/31/2015    Past Surgical History:  Procedure Laterality Date  . ABDOMINAL HYSTERECTOMY    . ABLATION ON ENDOMETRIOSIS    . APPENDECTOMY    . CHOLECYSTECTOMY    . HERNIA REPAIR    . KNEE SURGERY    . OVARIAN CYST REMOVAL       OB History   No obstetric history on file.     Family History  Problem Relation Age of Onset  . Breast cancer Paternal Aunt 33    Social History   Tobacco Use  . Smoking status: Never Smoker  . Smokeless tobacco: Never Used  Vaping Use  . Vaping Use: Never used  Substance Use Topics  . Alcohol use: Not Currently  . Drug use: No    Home Medications Prior to Admission medications     Medication Sig Start Date End Date Taking? Authorizing Provider  acetaminophen (TYLENOL) 325 MG tablet Take 650 mg by mouth every 6 (six) hours as needed for mild pain.    [provider]  albuterol (PROVENTIL HFA;VENTOLIN HFA) 108 (90 BASE) MCG/ACT inhaler Inhale 1 puff into the lungs every 6 (six) hours as needed for wheezing or shortness of breath.    [provider]  albuterol (PROVENTIL) (2.5 MG/3ML) 0.083% nebulizer solution Take 2.5 mg by nebulization every 8 (eight) hours as needed for wheezing or shortness of breath.    [provider]  amLODipine (NORVASC) 5 MG tablet Take 5 mg by mouth daily.    [provider]  Beclomethasone Dipropionate (QNASL) 80 MCG/ACT AERS Place 1 spray into the nose 2 (two) times daily.     [provider]  budesonide (RHINOCORT ALLERGY) 32 MCG/ACT nasal spray Place 1 spray into both nostrils daily.    [provider]  bupivacaine (MARCAINE) 0.5 % injection 10 mLs by Infiltration route See admin instructions. 2-3 times per day combined with Sodium Bicarbonate for bladder instillation.    [provider]  Calcium Carb-Cholecalciferol (CALCIUM-VITAMIN D3) 600-400 MG-UNIT CAPS Take 1 capsule by mouth once a week.     [provider]  calcium-vitamin D (OSCAL WITH D) 500-200 MG-UNIT per tablet Take 1 tablet by mouth 2 (  two) times daily.     [provider]  cholestyramine (QUESTRAN) 4 g packet Take 4 g by mouth 2 (two) times daily.    [provider]  CRANBERRY EXTRACT PO Take 1 tablet by mouth daily.     [provider]  doxycycline (VIBRAMYCIN) 100 MG capsule Take 1 capsule (100 mg total) by mouth 2 (two) times daily. 12/06/19   Pollina, Canary Brim, MD  Dupilumab 300 MG/2ML SOSY Inject 2 mLs into the skin every 14 (fourteen) days.  06/13/17   [provider]  EPINEPHrine (EPIPEN 2-PAK) 0.3 mg/0.3 mL IJ SOAJ injection Inject 0.3 mg into the muscle as needed (for  anaphylaxis).    [provider]  erythromycin ophthalmic ointment APPLY TO EYELIDS 2 3 TIMES DAILY FOR 1 WEEK 01/13/18   [provider]  FIBER PO Take 2 tablets by mouth daily.    [provider]  fluticasone (FLOVENT HFA) 220 MCG/ACT inhaler Inhale 2 puffs into the lungs 2 (two) times daily.  09/29/17   [provider]  glucosamine-chondroitin (COSAMIN DS) 500-400 MG tablet Take 1 tablet by mouth daily.    [provider]  guaiFENesin (MUCINEX) 600 MG 12 hr tablet Take 1,200 mg by mouth 2 (two) times daily as needed for cough.     [provider]  heparin 32202 UNIT/ML injection 20,000 Units See admin instructions. Instilled into bladder twice daily 06/20/17   [provider]  HYDROcodone-acetaminophen (NORCO/VICODIN) 5-325 MG tablet Take 1 tablet by mouth every 6 (six) hours as needed for moderate pain.    [provider]  ipratropium (ATROVENT) 0.02 % nebulizer solution Inhale into the lungs. 03/28/16   [provider]  ipratropium (ATROVENT) 0.06 % nasal spray Place 1 spray into both nostrils 2 (two) times daily.     [provider]  levocetirizine (XYZAL) 5 MG tablet Take 5 mg by mouth every morning.     [provider]  levonorgestrel (MIRENA) 20 MCG/24HR IUD 1 each by Intrauterine route once.    [provider]  losartan (COZAAR) 50 MG tablet Take 50 mg by mouth daily.    [provider]  Methen-Hyosc-Meth Blue-Na Phos (UROGESIC-BLUE) 81.6 MG TABS Take 1 tablet by mouth See admin instructions. Takes 2-3 times daily    [provider]  metroNIDAZOLE (METROGEL) 0.75 % gel Apply 1 application topically daily.  02/02/18   [provider]  Multiple Vitamin (MULTIVITAMIN WITH MINERALS) TABS tablet Take 1 tablet by mouth daily.    [provider]  pantoprazole (PROTONIX) 20 MG tablet Take 1 tablet (20 mg total) by mouth daily. 09/22/18   Bethann Berkshire, MD    pentosan polysulfate (ELMIRON) 100 MG capsule Take 100 mg by mouth 3 (three) times daily.    [provider]  Tiotropium Bromide-Olodaterol (STIOLTO RESPIMAT) 2.5-2.5 MCG/ACT AERS Inhale 1 puff into the lungs 2 (two) times daily.    [provider]  triamcinolone acetonide (TRIESENCE) 40 MG/ML SUSP Inject 40 mg into the muscle See admin instructions.  05/29/17   [provider]  Vitamin D, Ergocalciferol, (DRISDOL) 1.25 MG (50000 UT) CAPS capsule Take 50,000 Units by mouth every 7 (seven) days.  10/04/17   [provider]  zileuton (ZYFLO CR) 600 MG CR tablet Take 1,200 mg by mouth 2 (two) times daily.    [provider]    Allergies    Allspice [pimenta], Other, Xolair [omalizumab], Amitriptyline, Duloxetine, Ibuprofen, Nsaids, Sudafed [pseudoephedrine hcl], Sudafed [pseudoephedrine], Tape,  Benadryl [diphenhydramine], Nucala [mepolizumab], Phenylephrine hcl, Phenylephrine-guaifenesin, Ceclor [cefaclor], Cymbalta [duloxetine hcl], and Neomycin-bacitracin zn-polymyx  Review of Systems   Review of Systems All systems reviewed and negative, other than as noted in HPI.  Physical Exam Updated Vital Signs BP (!) 147/101 (BP Location: Right Arm)   Pulse (!) 111   Temp 98.9 F (37.2 C) (Oral)   Resp 18   Ht 5\' 6"  (1.676 m)   Wt 83.9 kg   LMP 03/08/2013   SpO2 97%   BMI 29.86 kg/m   Physical Exam Vitals and nursing note reviewed.  Constitutional:      General: She is not in acute distress.    Appearance: She is well-developed.  HENT:     Head: Normocephalic and atraumatic.  Eyes:     General:        Right eye: No discharge.        Left eye: No discharge.     Conjunctiva/sclera: Conjunctivae normal.  Cardiovascular:     Rate and Rhythm: Normal rate and regular rhythm.     Heart sounds: Normal heart sounds. No murmur heard.  No friction rub. No gallop.   Pulmonary:     Effort: Pulmonary effort is normal. No respiratory distress.      Breath sounds: Normal breath sounds.  Abdominal:     General: There is no distension.     Palpations: Abdomen is soft.     Tenderness: There is no abdominal tenderness.  Musculoskeletal:     Cervical back: Neck supple.     Comments: 4 cm laceration to the medial aspect of the right lower leg.  2 other sutured wounds noted which appear to be healing without complication.  Skin:    General: Skin is warm and dry.  Neurological:     Mental Status: She is alert.  Psychiatric:        Behavior: Behavior normal.        Thought Content: Thought content normal.     ED Results / Procedures / Treatments   Labs (all labs ordered are listed, but only abnormal results are displayed) Labs Reviewed - No data to display  EKG None  Radiology No results found.  Procedures Procedures (including critical care time)  LACERATION REPAIR Performed by: Raeford RazorStephen Jerimey Burridge Authorized by: Raeford RazorStephen Wilburn Keir Consent: Verbal consent obtained. Risks and benefits: risks, benefits and alternatives were discussed Consent given by: patient Patient identity confirmed: provided demographic data Prepped and Draped in normal sterile fashion Wound explored  Laceration Location: R leg  Laceration Length: 4 cm  No Foreign Bodies seen or palpated  Anesthesia: local infiltration  Local anesthetic: lidocaine 1% w epinephrine  Anesthetic total: 4 ml  Irrigation method: syringe Amount of cleaning: copious  Skin closure: 3-0 prolene  Number of sutures: 4  Technique: simple interrupted   Patient tolerance: Patient tolerated the procedure well with no immediate complications.   Medications Ordered in ED Medications  lidocaine-EPINEPHrine (XYLOCAINE-EPINEPHrine) 1 %-1:200000 (PF) injection 10 mL (has no administration in time range)  clindamycin (CLEOCIN) capsule 300 mg (has no administration in time range)    ED Course  I have reviewed the triage vital signs and the nursing notes.  Pertinent labs &  imaging results that were available during my care of the patient were reviewed by me and considered in my medical decision making (see chart for details).    MDM Rules/Calculators/A&P  43yF with laceration to RLE from a cat scratch. I am very hesitant to close it given the mechanism and since she is chronically on steroids for adrenal suppression. That said, she had recently had two other lacerations by the same mechanism to the same leg closed and they appear to be healing well. I discussed risks (primarily infection) of closure versus benefit. We elected for closure. Only able to partially close the wound because the flap was too thin. Discussed continued wound care and return precuations.    Final Clinical Impression(s) / ED Diagnoses Final diagnoses:  Laceration of right lower extremity, initial encounter    Rx / DC Orders ED Discharge Orders    None       Raeford Razor, MD 12/13/19 1126

## 2019-12-13 NOTE — ED Triage Notes (Signed)
Pt states her cat scratched her leg this am; laceration bandaged at this time; pt states she is on steroids and it causes her skin to be thin

## 2020-04-08 DIAGNOSIS — J45901 Unspecified asthma with (acute) exacerbation: Secondary | ICD-10-CM | POA: Diagnosis not present

## 2020-04-08 DIAGNOSIS — N3010 Interstitial cystitis (chronic) without hematuria: Secondary | ICD-10-CM | POA: Diagnosis not present

## 2020-05-09 DEATH — deceased

## 2020-07-25 IMAGING — MG DIGITAL SCREENING BILATERAL MAMMOGRAM WITH TOMO AND CAD
8 series · 8 of 24 positions shown · non-contrast
Comparison: Previous exam(s).

CLINICAL DATA: Screening.

EXAM:
DIGITAL SCREENING BILATERAL MAMMOGRAM WITH TOMO AND CAD

[R MLO synth-2D]
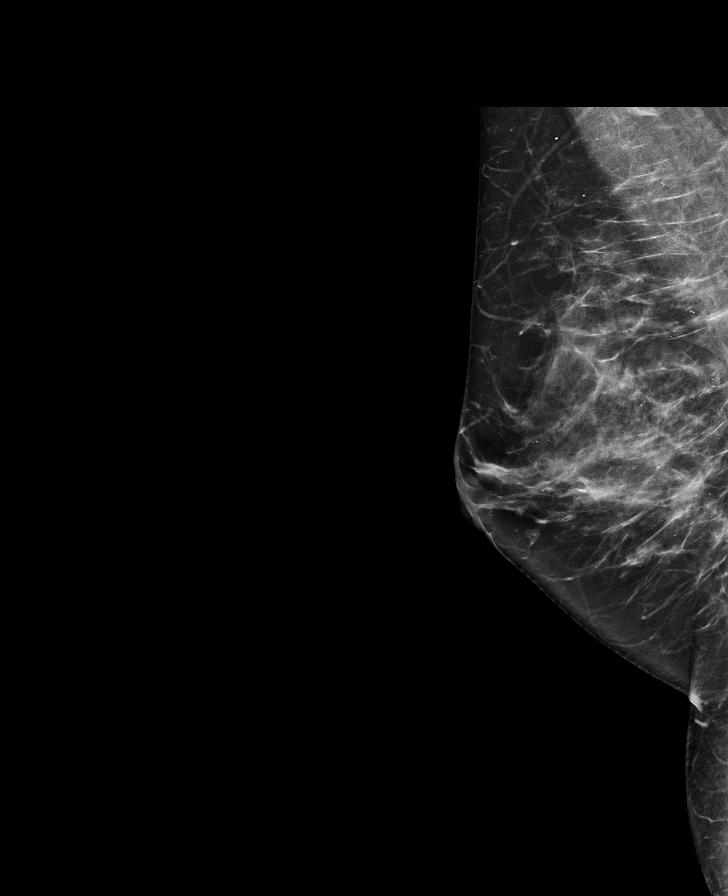

[L CC synth-2D]
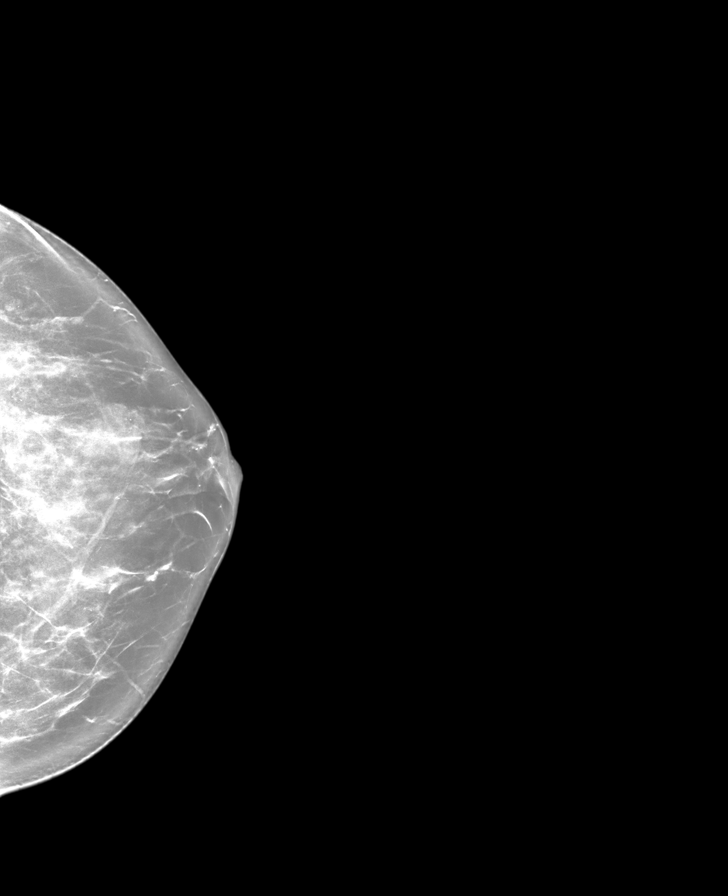

[R CC synth-2D]
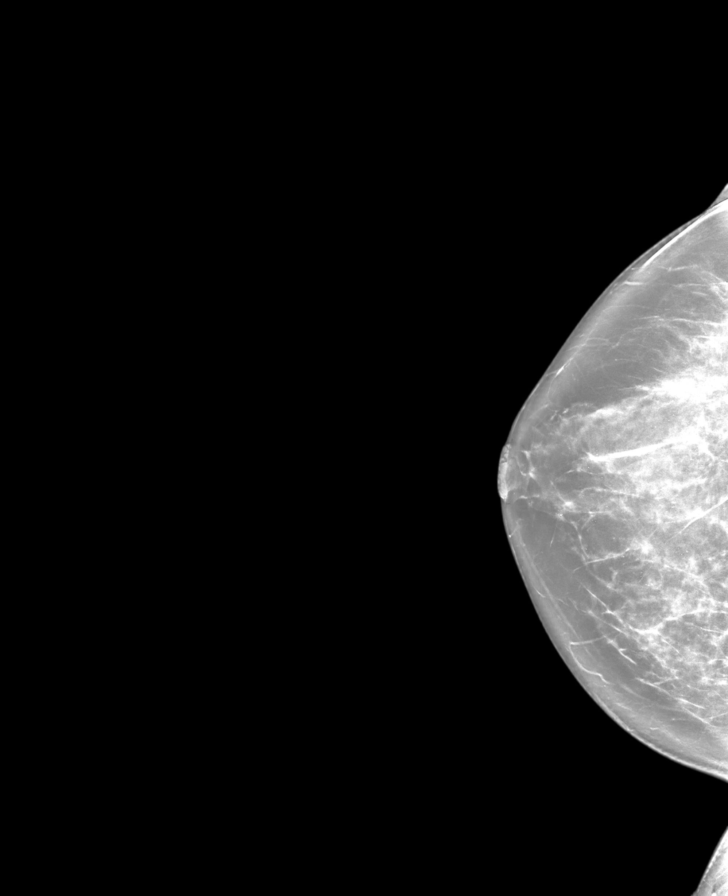

[L MLO synth-2D]
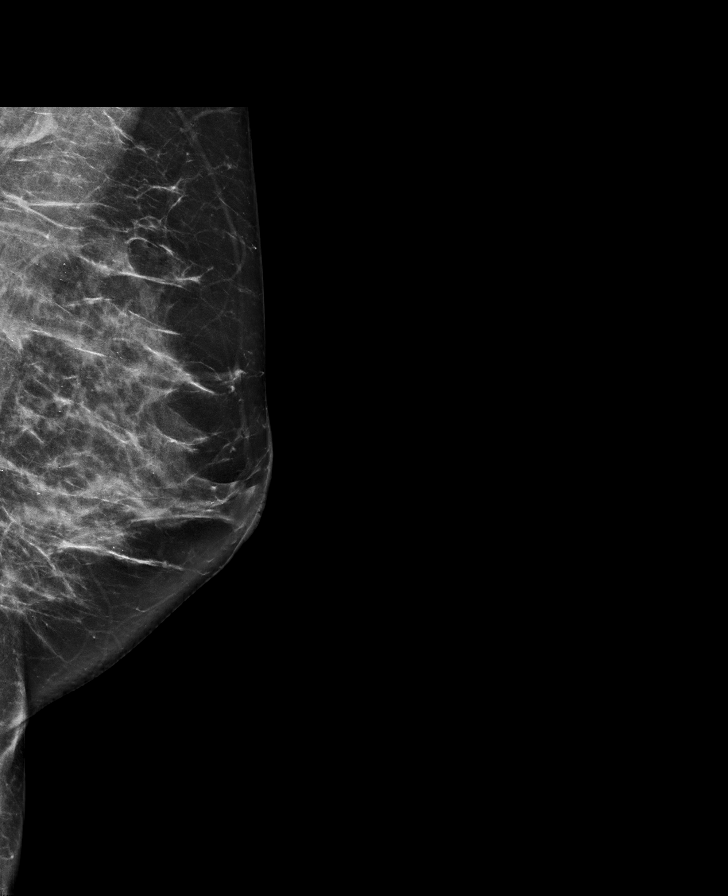

[L CC tomo · tomo slice 38/75.0]
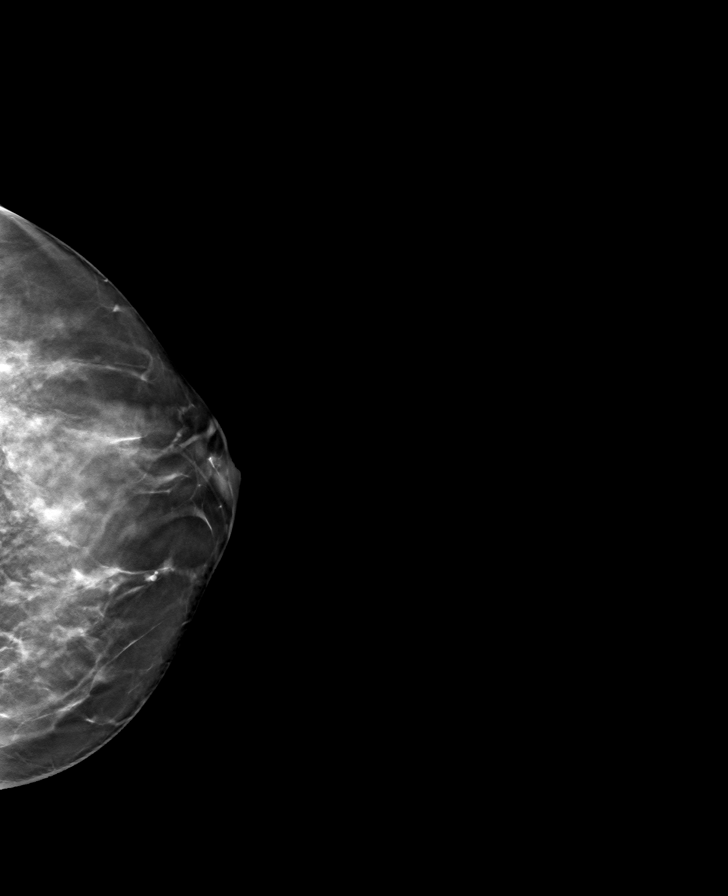

[R MLO tomo · tomo slice 37/73.0]
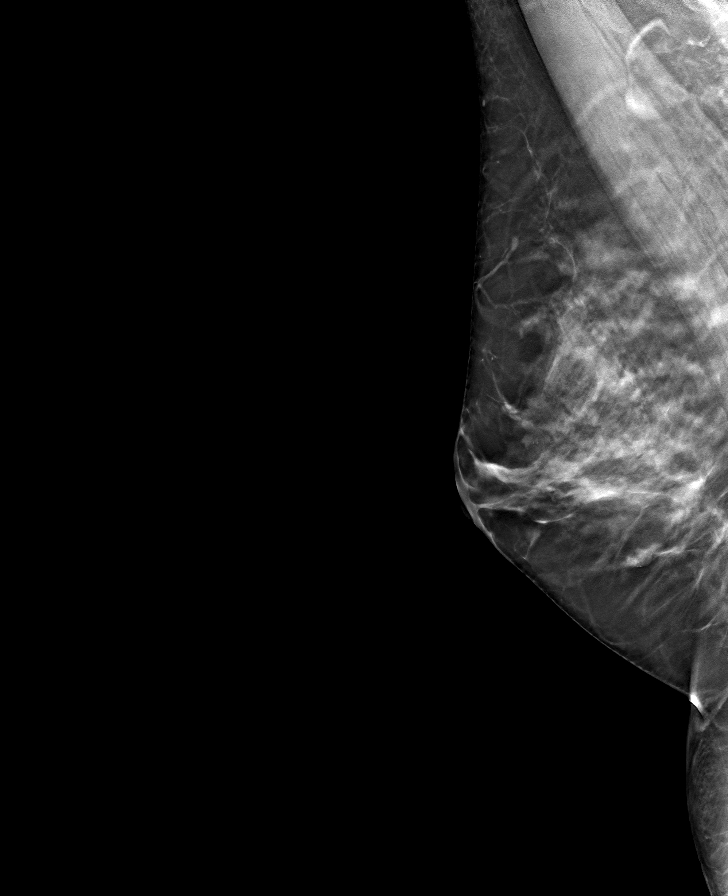

[R CC tomo · tomo slice 39/78.0]
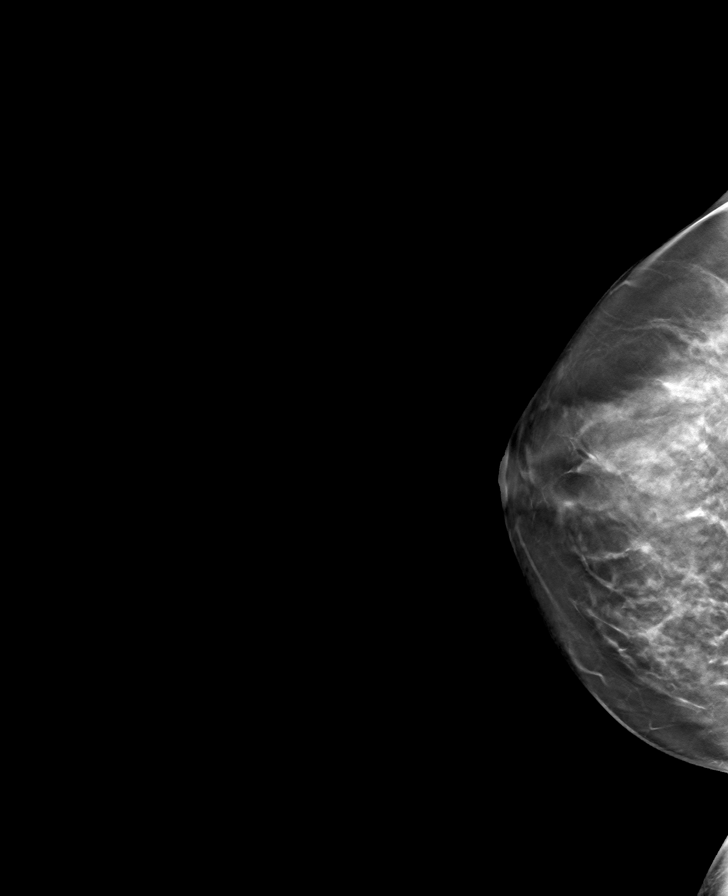

[L MLO tomo · tomo slice 39/76.0]
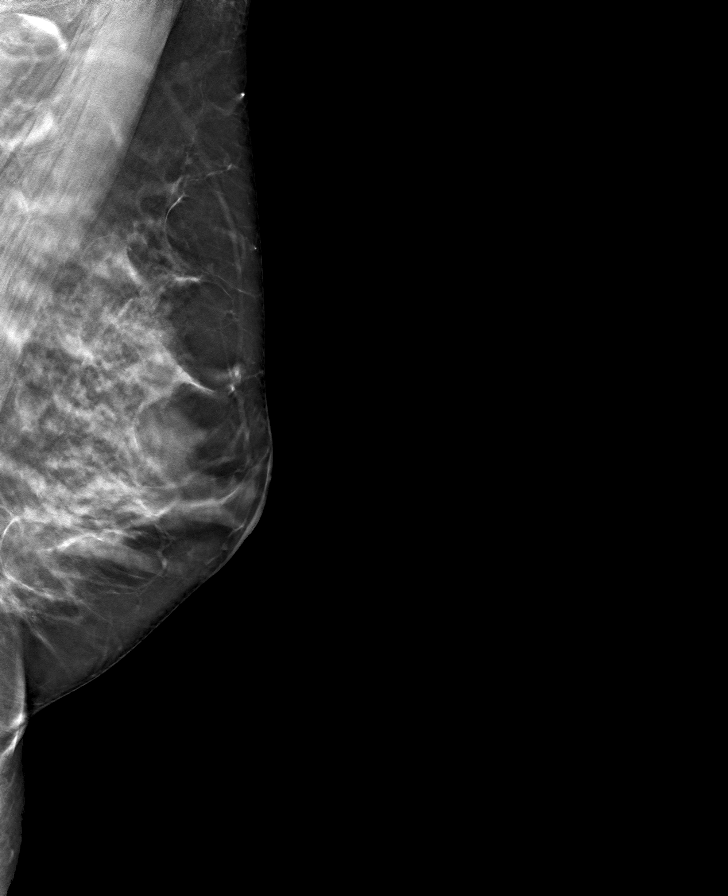

[8 of 24 positions shown; findings below may reference images not displayed]

ACR Breast Density Category c: The breast tissue is heterogeneously
dense, which may obscure small masses.
FINDINGS: There are no findings suspicious for malignancy. Images were
processed with CAD.
IMPRESSION: No mammographic evidence of malignancy. A result letter of this
screening mammogram will be mailed directly to the patient.

RECOMMENDATION:
Screening mammogram in one year. (Code:FT-U-LHB)

BI-RADS CATEGORY  1: Negative.
